# Patient Record
Sex: Female | Born: 1950 | Race: Black or African American | Hispanic: No | Smoking: Never smoker
Health system: Southern US, Community
[De-identification: ages and names within clinical notes are randomized; demographics above are authoritative.]

## PROBLEM LIST (undated history)

## (undated) DIAGNOSIS — N926 Irregular menstruation, unspecified: Secondary | ICD-10-CM

## (undated) DIAGNOSIS — G43909 Migraine, unspecified, not intractable, without status migrainosus: Secondary | ICD-10-CM

## (undated) DIAGNOSIS — E119 Type 2 diabetes mellitus without complications: Secondary | ICD-10-CM

## (undated) DIAGNOSIS — R51 Headache: Secondary | ICD-10-CM

## (undated) DIAGNOSIS — Z21 Asymptomatic human immunodeficiency virus [HIV] infection status: Secondary | ICD-10-CM

## (undated) HISTORY — DX: Irregular menstruation, unspecified: N92.6

## (undated) HISTORY — DX: Headache: R51

## (undated) HISTORY — DX: Migraine, unspecified, not intractable, without status migrainosus: G43.909

## (undated) HISTORY — DX: Type 2 diabetes mellitus without complications: E11.9

## (undated) HISTORY — PX: TUBAL LIGATION: SHX77

## (undated) HISTORY — DX: Asymptomatic human immunodeficiency virus (hiv) infection status: Z21

---

## 1996-07-17 DIAGNOSIS — Z21 Asymptomatic human immunodeficiency virus [HIV] infection status: Secondary | ICD-10-CM

## 1996-07-17 HISTORY — DX: Asymptomatic human immunodeficiency virus (hiv) infection status: Z21

## 1997-01-14 ENCOUNTER — Encounter (INDEPENDENT_AMBULATORY_CARE_PROVIDER_SITE_OTHER): Payer: Self-pay | Admitting: *Deleted

## 1997-01-14 LAB — CONVERTED CEMR LAB: CD4 Count: 210 microliters

## 1997-12-22 ENCOUNTER — Ambulatory Visit (HOSPITAL_COMMUNITY): Admission: RE | Admit: 1997-12-22 | Discharge: 1997-12-22 | Payer: Self-pay | Admitting: Obstetrics and Gynecology

## 1998-03-23 ENCOUNTER — Ambulatory Visit: Admission: RE | Admit: 1998-03-23 | Discharge: 1998-03-23 | Payer: Self-pay | Admitting: Family Medicine

## 1998-03-30 ENCOUNTER — Other Ambulatory Visit: Admission: RE | Admit: 1998-03-30 | Discharge: 1998-03-30 | Payer: Self-pay | Admitting: Obstetrics and Gynecology

## 1998-04-13 ENCOUNTER — Encounter: Admission: RE | Admit: 1998-04-13 | Discharge: 1998-04-13 | Payer: Self-pay | Admitting: Internal Medicine

## 1998-12-27 ENCOUNTER — Ambulatory Visit (HOSPITAL_COMMUNITY): Admission: RE | Admit: 1998-12-27 | Discharge: 1998-12-27 | Payer: Self-pay | Admitting: Obstetrics and Gynecology

## 1999-04-19 ENCOUNTER — Encounter: Admission: RE | Admit: 1999-04-19 | Discharge: 1999-04-19 | Payer: Self-pay | Admitting: Internal Medicine

## 2000-04-25 ENCOUNTER — Other Ambulatory Visit: Admission: RE | Admit: 2000-04-25 | Discharge: 2000-04-25 | Payer: Self-pay | Admitting: Obstetrics and Gynecology

## 2000-05-11 ENCOUNTER — Encounter: Admission: RE | Admit: 2000-05-11 | Discharge: 2000-05-11 | Payer: Self-pay | Admitting: Internal Medicine

## 2000-10-03 ENCOUNTER — Encounter: Payer: Self-pay | Admitting: Obstetrics and Gynecology

## 2000-10-03 ENCOUNTER — Ambulatory Visit (HOSPITAL_COMMUNITY): Admission: RE | Admit: 2000-10-03 | Discharge: 2000-10-03 | Payer: Self-pay | Admitting: Obstetrics and Gynecology

## 2001-04-08 ENCOUNTER — Encounter: Admission: RE | Admit: 2001-04-08 | Discharge: 2001-04-08 | Payer: Self-pay | Admitting: Internal Medicine

## 2001-04-08 ENCOUNTER — Ambulatory Visit (HOSPITAL_COMMUNITY): Admission: RE | Admit: 2001-04-08 | Discharge: 2001-04-08 | Payer: Self-pay | Admitting: Internal Medicine

## 2001-04-30 ENCOUNTER — Encounter: Admission: RE | Admit: 2001-04-30 | Discharge: 2001-04-30 | Payer: Self-pay | Admitting: Internal Medicine

## 2001-09-23 ENCOUNTER — Other Ambulatory Visit: Admission: RE | Admit: 2001-09-23 | Discharge: 2001-09-23 | Payer: Self-pay | Admitting: Obstetrics and Gynecology

## 2001-10-10 ENCOUNTER — Ambulatory Visit (HOSPITAL_COMMUNITY): Admission: RE | Admit: 2001-10-10 | Discharge: 2001-10-10 | Payer: Self-pay | Admitting: Obstetrics and Gynecology

## 2001-10-10 ENCOUNTER — Encounter: Payer: Self-pay | Admitting: Obstetrics and Gynecology

## 2001-10-14 ENCOUNTER — Encounter: Admission: RE | Admit: 2001-10-14 | Discharge: 2001-10-14 | Payer: Self-pay | Admitting: Obstetrics and Gynecology

## 2001-10-14 ENCOUNTER — Encounter: Payer: Self-pay | Admitting: Obstetrics and Gynecology

## 2002-04-28 ENCOUNTER — Ambulatory Visit (HOSPITAL_COMMUNITY): Admission: RE | Admit: 2002-04-28 | Discharge: 2002-04-28 | Payer: Self-pay | Admitting: Internal Medicine

## 2002-04-28 ENCOUNTER — Encounter: Admission: RE | Admit: 2002-04-28 | Discharge: 2002-04-28 | Payer: Self-pay | Admitting: Internal Medicine

## 2002-05-27 ENCOUNTER — Encounter: Admission: RE | Admit: 2002-05-27 | Discharge: 2002-05-27 | Payer: Self-pay | Admitting: Internal Medicine

## 2002-08-20 ENCOUNTER — Other Ambulatory Visit: Admission: RE | Admit: 2002-08-20 | Discharge: 2002-08-20 | Payer: Self-pay | Admitting: Obstetrics and Gynecology

## 2002-11-14 ENCOUNTER — Encounter: Admission: RE | Admit: 2002-11-14 | Discharge: 2002-11-14 | Payer: Self-pay | Admitting: Obstetrics and Gynecology

## 2002-11-14 ENCOUNTER — Encounter: Payer: Self-pay | Admitting: Obstetrics and Gynecology

## 2002-12-05 ENCOUNTER — Encounter: Payer: Self-pay | Admitting: Internal Medicine

## 2002-12-05 ENCOUNTER — Encounter: Admission: RE | Admit: 2002-12-05 | Discharge: 2002-12-05 | Payer: Self-pay | Admitting: Internal Medicine

## 2002-12-23 ENCOUNTER — Encounter: Admission: RE | Admit: 2002-12-23 | Discharge: 2002-12-23 | Payer: Self-pay | Admitting: Internal Medicine

## 2003-05-27 ENCOUNTER — Encounter: Admission: RE | Admit: 2003-05-27 | Discharge: 2003-05-27 | Payer: Self-pay | Admitting: Internal Medicine

## 2003-05-27 ENCOUNTER — Encounter: Payer: Self-pay | Admitting: Internal Medicine

## 2003-05-27 ENCOUNTER — Ambulatory Visit (HOSPITAL_COMMUNITY): Admission: RE | Admit: 2003-05-27 | Discharge: 2003-05-27 | Payer: Self-pay | Admitting: Internal Medicine

## 2003-06-15 ENCOUNTER — Ambulatory Visit (HOSPITAL_COMMUNITY): Admission: RE | Admit: 2003-06-15 | Discharge: 2003-06-15 | Payer: Self-pay | Admitting: Gastroenterology

## 2003-06-16 ENCOUNTER — Encounter: Admission: RE | Admit: 2003-06-16 | Discharge: 2003-06-16 | Payer: Self-pay | Admitting: Internal Medicine

## 2003-09-07 ENCOUNTER — Other Ambulatory Visit: Admission: RE | Admit: 2003-09-07 | Discharge: 2003-09-07 | Payer: Self-pay | Admitting: Obstetrics and Gynecology

## 2003-12-08 ENCOUNTER — Encounter: Admission: RE | Admit: 2003-12-08 | Discharge: 2003-12-08 | Payer: Self-pay | Admitting: Internal Medicine

## 2003-12-08 ENCOUNTER — Ambulatory Visit (HOSPITAL_COMMUNITY): Admission: RE | Admit: 2003-12-08 | Discharge: 2003-12-08 | Payer: Self-pay | Admitting: Internal Medicine

## 2003-12-22 ENCOUNTER — Encounter: Admission: RE | Admit: 2003-12-22 | Discharge: 2003-12-22 | Payer: Self-pay | Admitting: Internal Medicine

## 2004-01-22 ENCOUNTER — Encounter: Admission: RE | Admit: 2004-01-22 | Discharge: 2004-01-22 | Payer: Self-pay | Admitting: Obstetrics and Gynecology

## 2004-05-19 ENCOUNTER — Ambulatory Visit (HOSPITAL_COMMUNITY): Admission: RE | Admit: 2004-05-19 | Discharge: 2004-05-19 | Payer: Self-pay | Admitting: Internal Medicine

## 2004-05-19 ENCOUNTER — Ambulatory Visit: Payer: Self-pay | Admitting: Internal Medicine

## 2004-05-23 ENCOUNTER — Encounter: Admission: RE | Admit: 2004-05-23 | Discharge: 2004-06-27 | Payer: Self-pay | Admitting: Family Medicine

## 2004-06-21 ENCOUNTER — Ambulatory Visit: Payer: Self-pay | Admitting: Internal Medicine

## 2004-12-14 ENCOUNTER — Other Ambulatory Visit: Admission: RE | Admit: 2004-12-14 | Discharge: 2004-12-14 | Payer: Self-pay | Admitting: Obstetrics and Gynecology

## 2005-01-09 ENCOUNTER — Encounter (INDEPENDENT_AMBULATORY_CARE_PROVIDER_SITE_OTHER): Payer: Self-pay | Admitting: *Deleted

## 2005-01-09 ENCOUNTER — Ambulatory Visit: Payer: Self-pay | Admitting: Internal Medicine

## 2005-01-09 ENCOUNTER — Ambulatory Visit (HOSPITAL_COMMUNITY): Admission: RE | Admit: 2005-01-09 | Discharge: 2005-01-09 | Payer: Self-pay | Admitting: Internal Medicine

## 2005-01-09 LAB — CONVERTED CEMR LAB: HIV 1 RNA Quant: 399 copies/mL

## 2005-01-24 ENCOUNTER — Ambulatory Visit: Payer: Self-pay | Admitting: Internal Medicine

## 2005-02-17 ENCOUNTER — Encounter: Admission: RE | Admit: 2005-02-17 | Discharge: 2005-02-17 | Payer: Self-pay | Admitting: Obstetrics and Gynecology

## 2005-06-12 ENCOUNTER — Ambulatory Visit: Payer: Self-pay | Admitting: Internal Medicine

## 2005-06-12 ENCOUNTER — Ambulatory Visit (HOSPITAL_COMMUNITY): Admission: RE | Admit: 2005-06-12 | Discharge: 2005-06-12 | Payer: Self-pay | Admitting: Internal Medicine

## 2005-06-12 ENCOUNTER — Encounter (INDEPENDENT_AMBULATORY_CARE_PROVIDER_SITE_OTHER): Payer: Self-pay | Admitting: *Deleted

## 2005-06-12 LAB — CONVERTED CEMR LAB: HIV 1 RNA Quant: 399 copies/mL

## 2005-06-27 ENCOUNTER — Ambulatory Visit: Payer: Self-pay | Admitting: Internal Medicine

## 2005-07-04 ENCOUNTER — Ambulatory Visit: Payer: Self-pay | Admitting: Internal Medicine

## 2005-12-19 ENCOUNTER — Other Ambulatory Visit: Admission: RE | Admit: 2005-12-19 | Discharge: 2005-12-19 | Payer: Self-pay | Admitting: Obstetrics and Gynecology

## 2005-12-19 ENCOUNTER — Encounter (INDEPENDENT_AMBULATORY_CARE_PROVIDER_SITE_OTHER): Payer: Self-pay | Admitting: *Deleted

## 2005-12-19 LAB — CONVERTED CEMR LAB: Pap Smear: NORMAL

## 2006-01-04 ENCOUNTER — Encounter (INDEPENDENT_AMBULATORY_CARE_PROVIDER_SITE_OTHER): Payer: Self-pay | Admitting: *Deleted

## 2006-01-04 ENCOUNTER — Ambulatory Visit: Payer: Self-pay | Admitting: Internal Medicine

## 2006-01-04 ENCOUNTER — Encounter: Admission: RE | Admit: 2006-01-04 | Discharge: 2006-01-04 | Payer: Self-pay | Admitting: Internal Medicine

## 2006-01-04 LAB — CONVERTED CEMR LAB
CD4 Count: 700 microliters
HIV 1 RNA Quant: 399 copies/mL

## 2006-01-23 ENCOUNTER — Ambulatory Visit: Payer: Self-pay | Admitting: Internal Medicine

## 2006-03-09 ENCOUNTER — Emergency Department (HOSPITAL_COMMUNITY): Admission: EM | Admit: 2006-03-09 | Discharge: 2006-03-09 | Payer: Self-pay | Admitting: Emergency Medicine

## 2006-03-09 IMAGING — CT CT HEAD W/O CM
5 of 6 series · 18 of 37 positions shown, 19 images · IV contrast (agent unspecified)
Comparison: none

CLINICAL DATA: Status post fall.
 HEAD CT WITHOUT CONTRAST:
TECHNIQUE: Contiguous axial images were obtained from the base of the skull through the vertex according to standard protocol without contrast.
TECHNIQUE: Multidetector CT imaging of the cervical spine was performed.  Multiplanar CT image reconstructions were also generated.

[Series 2: brain · axial · 0.47mm/px · z∈[-61,-16]mm · 2 of 28 slices shown, 3 images]
[im 10/28  brain]
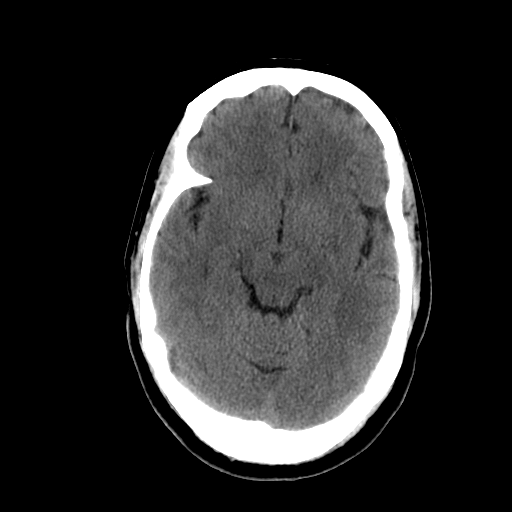
[im 10/28  bone]
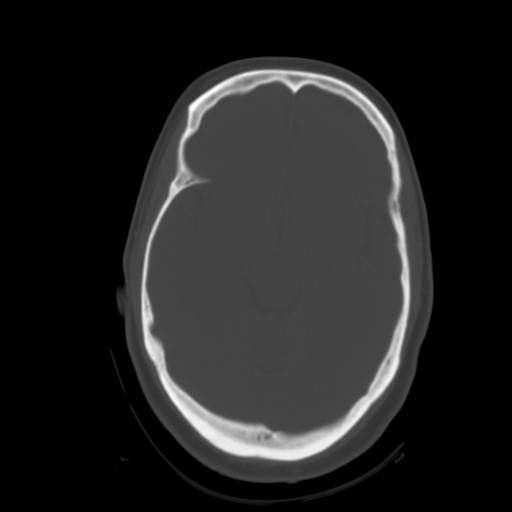
[im 19/28  brain]
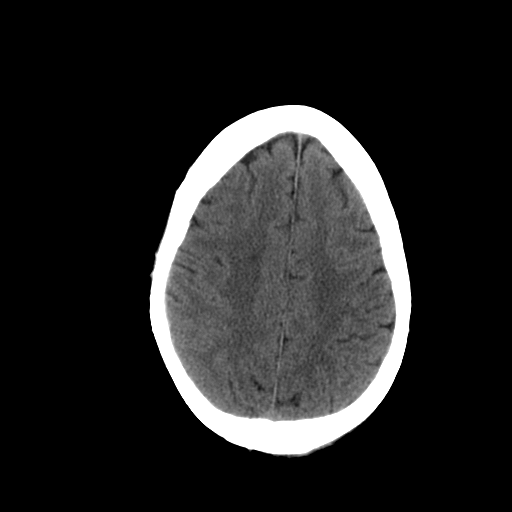

[Series 3: recon 2: brain · axial · 0.47mm/px · z∈[-61,-16]mm · 2 of 28 slices shown]
[im 10/28  brain]
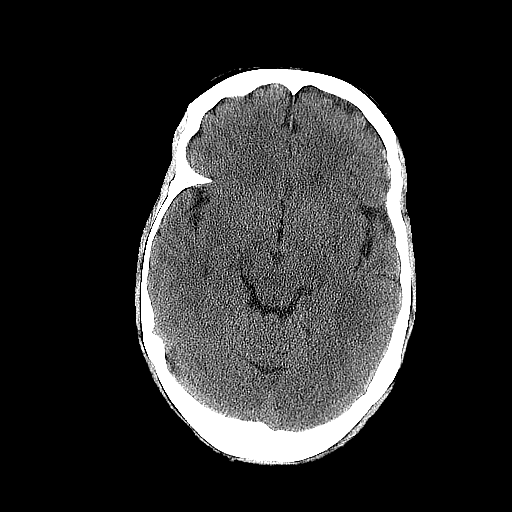
[im 19/28  brain]
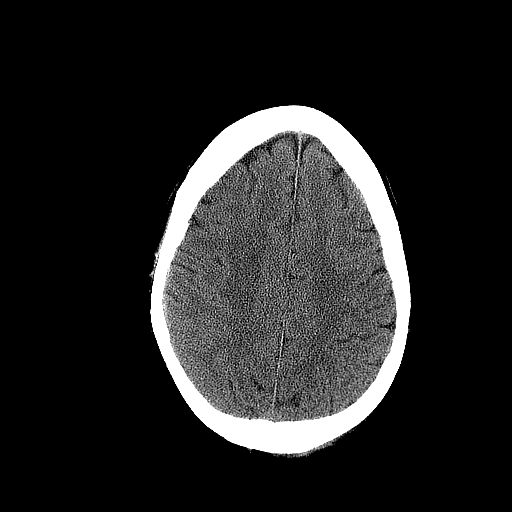

[Series 5: cervical spine · axial · 0.27mm/px · z∈[-262,-129]mm · 8 of 67 slices shown]
[im 7/67  brain]
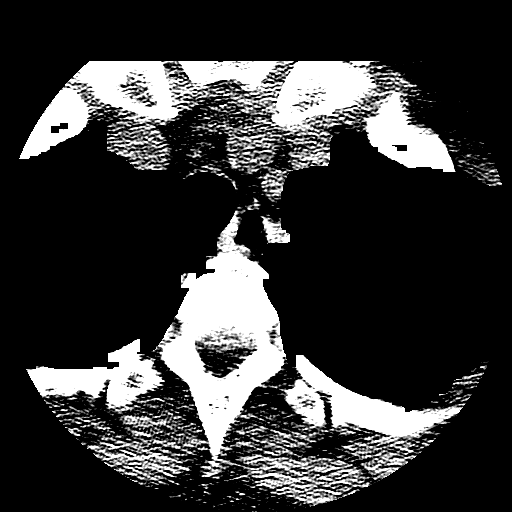
[im 14/67  brain]
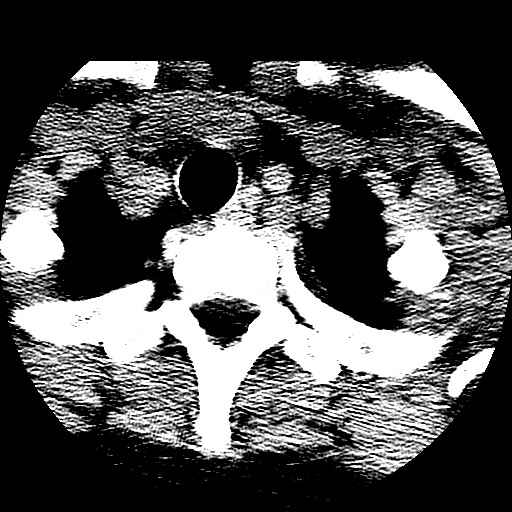
[im 20/67  brain]
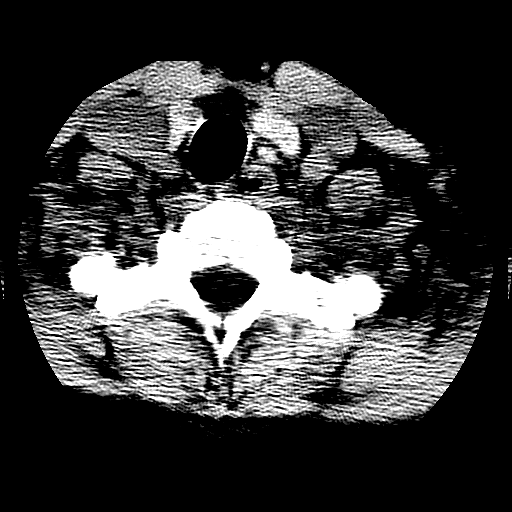
[im 27/67  brain]
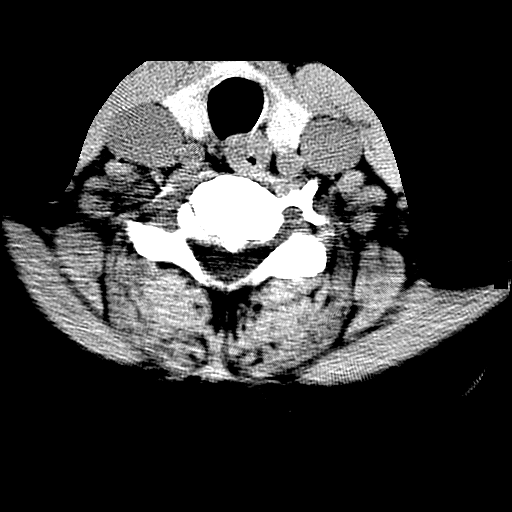
[im 40/67  brain]
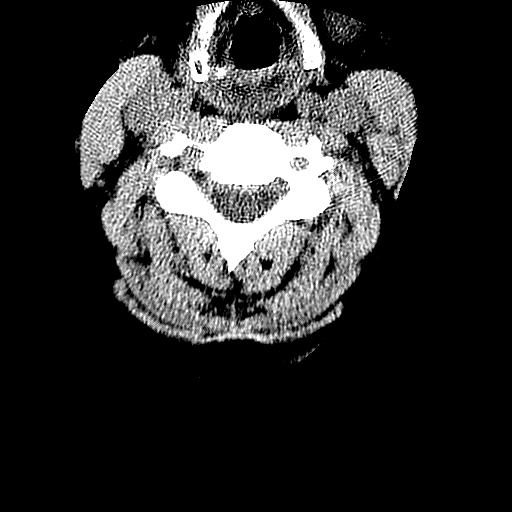
[im 47/67  brain]
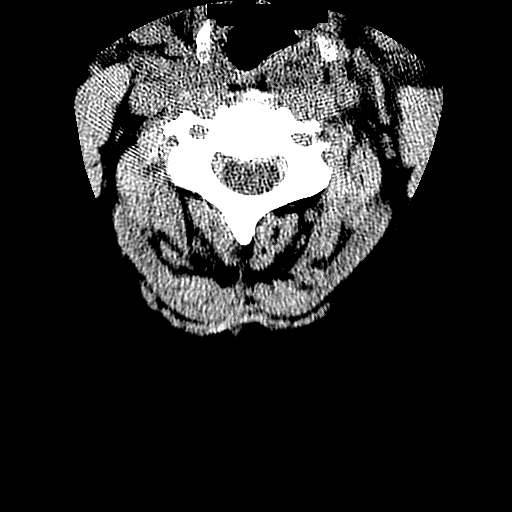
[im 53/67  brain]
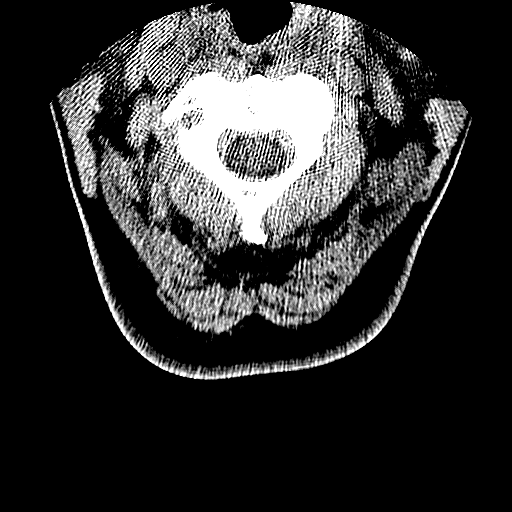
[im 60/67  brain]
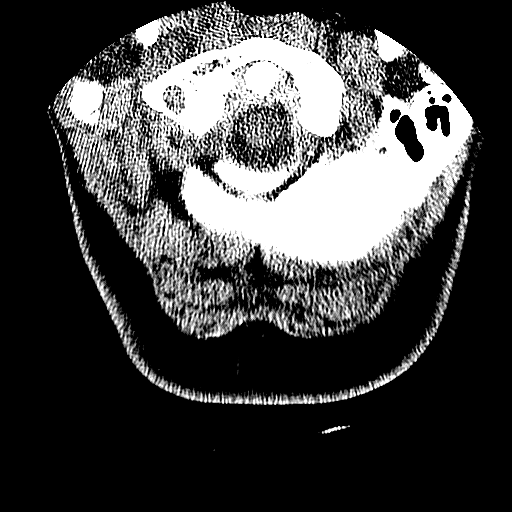

[Series 6: recon 2: cervical spine · axial · 0.27mm/px · z∈[-262,-229]mm · 3 of 67 slices shown]
[im 7/67  brain]
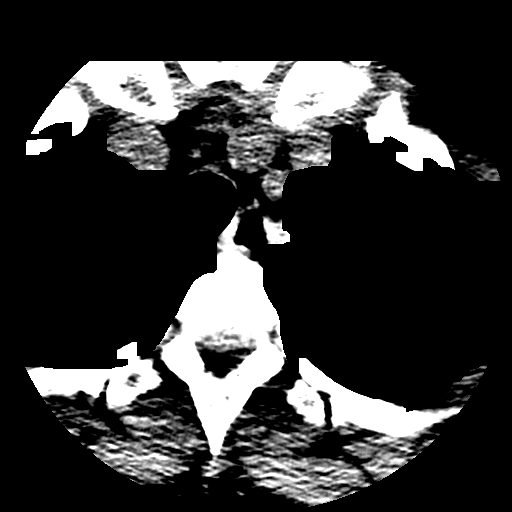
[im 14/67  brain]
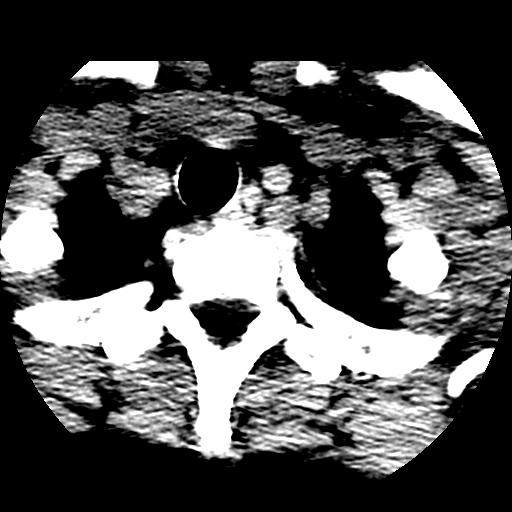
[im 20/67  brain]
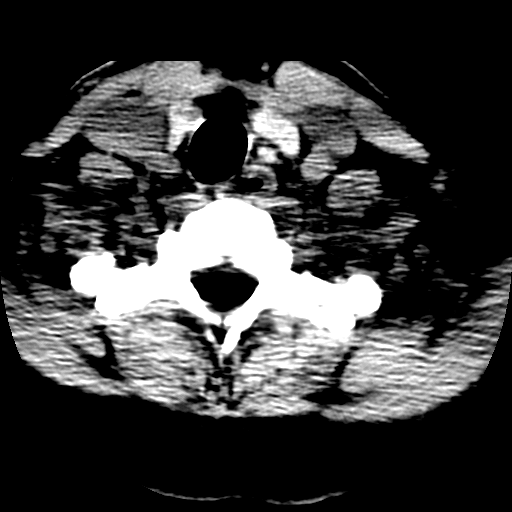

[Series 701: reformatted · coronal · 0.33mm/px · 3 of 36 slices shown]
[im 10/36  brain]
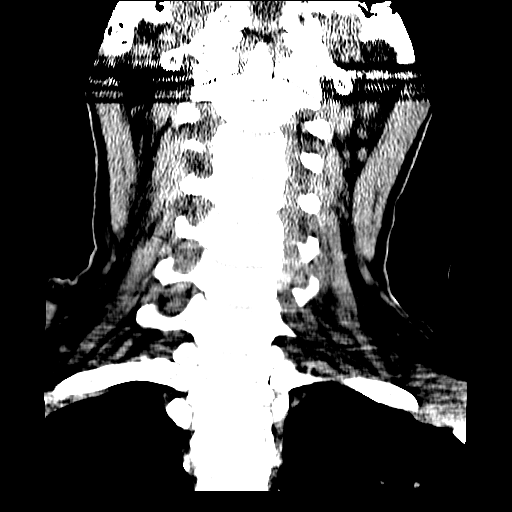
[im 15/36  brain]
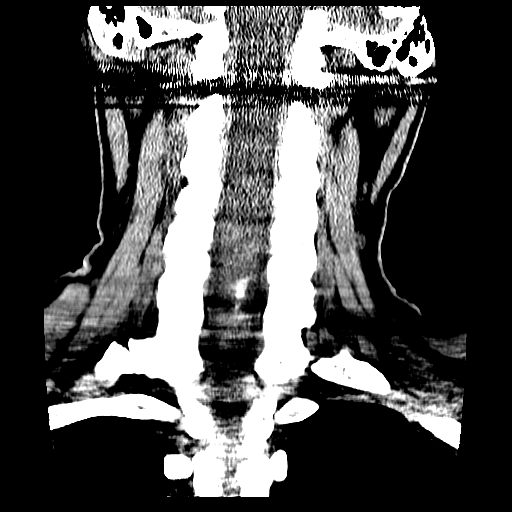
[im 19/36  brain]
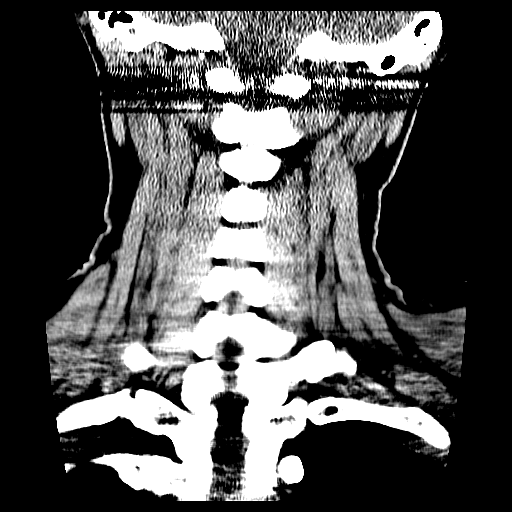

[18 of 37 positions shown; findings below may reference images not displayed]

FINDINGS: The brain parenchyma is normal in attenuation and morphology.  The ventricular volumes are within normal limits.  The midline is maintained.  There is no edema or mass effect.  
 Mastoid air cells and paranasal sinuses are normally aerated.
 Review of the bone windows shows no evidence for a skull fracture.
IMPRESSION: No acute intracranial abnormalities.  
 CERVICAL SPINE CT WITHOUT CONTRAST:
FINDINGS: The alignment of cervical spine is normal.  The vertebral body heights and the disc spaces are well preserved.  There are no dislocations.  No fractures are identified.
IMPRESSION: 1. No acute cervical spine CT findings. 
 2. Mild multilevel degenerative disc disease.

## 2006-03-12 ENCOUNTER — Emergency Department (HOSPITAL_COMMUNITY): Admission: EM | Admit: 2006-03-12 | Discharge: 2006-03-12 | Payer: Self-pay | Admitting: Emergency Medicine

## 2006-07-28 DIAGNOSIS — B20 Human immunodeficiency virus [HIV] disease: Secondary | ICD-10-CM | POA: Insufficient documentation

## 2006-07-28 DIAGNOSIS — R51 Headache: Secondary | ICD-10-CM | POA: Insufficient documentation

## 2006-07-28 DIAGNOSIS — R519 Headache, unspecified: Secondary | ICD-10-CM | POA: Insufficient documentation

## 2006-07-28 DIAGNOSIS — E119 Type 2 diabetes mellitus without complications: Secondary | ICD-10-CM | POA: Insufficient documentation

## 2006-08-03 ENCOUNTER — Encounter: Payer: Self-pay | Admitting: Internal Medicine

## 2006-09-10 ENCOUNTER — Encounter (INDEPENDENT_AMBULATORY_CARE_PROVIDER_SITE_OTHER): Payer: Self-pay | Admitting: *Deleted

## 2006-09-10 LAB — CONVERTED CEMR LAB

## 2006-09-23 ENCOUNTER — Encounter (INDEPENDENT_AMBULATORY_CARE_PROVIDER_SITE_OTHER): Payer: Self-pay | Admitting: *Deleted

## 2006-09-26 ENCOUNTER — Ambulatory Visit: Payer: Self-pay | Admitting: Internal Medicine

## 2006-09-26 ENCOUNTER — Encounter: Admission: RE | Admit: 2006-09-26 | Discharge: 2006-09-26 | Payer: Self-pay | Admitting: Internal Medicine

## 2006-09-26 LAB — CONVERTED CEMR LAB
AST: 15 units/L (ref 0–37)
Alkaline Phosphatase: 51 units/L (ref 39–117)
BUN: 12 mg/dL (ref 6–23)
Basophils Relative: 1 % (ref 0–1)
Bilirubin Urine: NEGATIVE
Creatinine, Ser: 0.6 mg/dL (ref 0.40–1.20)
Eosinophils Absolute: 0 10*3/uL (ref 0.0–0.7)
Eosinophils Relative: 1 % (ref 0–5)
Glucose, Bld: 119 mg/dL — ABNORMAL HIGH (ref 70–99)
HCT: 40.2 % (ref 36.0–46.0)
HDL: 65 mg/dL (ref >39)
HIV-1 RNA Quant, Log: <1.7 (ref <1.70)
Hemoglobin, Urine: NEGATIVE
Ketones, ur: NEGATIVE mg/dL
LDL Cholesterol: 138 mg/dL — ABNORMAL HIGH (ref 0–99)
Lymphs Abs: 1.4 10*3/uL (ref 0.7–3.3)
MCHC: 33.8 g/dL (ref 30.0–36.0)
MCV: 91.2 fL (ref 78.0–100.0)
Monocytes Relative: 8 % (ref 3–11)
Neutrophils Relative %: 54 % (ref 43–77)
Protein, ur: NEGATIVE mg/dL
RBC: 4.41 M/uL (ref 3.87–5.11)
Specific Gravity, Urine: 1.019 (ref 1.005–1.03)
Total Bilirubin: 0.2 mg/dL — ABNORMAL LOW (ref 0.3–1.2)
Total CHOL/HDL Ratio: 3.4
Triglycerides: 91 mg/dL (ref <150)
Urobilinogen, UA: 0.2 (ref 0.0–1.0)
VLDL: 18 mg/dL (ref 0–40)
WBC: 3.8 10*3/uL — ABNORMAL LOW (ref 4.0–10.5)

## 2006-10-01 ENCOUNTER — Encounter: Admission: RE | Admit: 2006-10-01 | Discharge: 2006-10-01 | Payer: Self-pay | Admitting: Obstetrics and Gynecology

## 2006-10-11 ENCOUNTER — Ambulatory Visit: Payer: Self-pay | Admitting: Internal Medicine

## 2007-04-26 ENCOUNTER — Encounter: Payer: Self-pay | Admitting: Internal Medicine

## 2007-07-16 ENCOUNTER — Encounter: Payer: Self-pay | Admitting: Internal Medicine

## 2007-08-13 ENCOUNTER — Encounter (INDEPENDENT_AMBULATORY_CARE_PROVIDER_SITE_OTHER): Payer: Self-pay | Admitting: *Deleted

## 2007-08-23 ENCOUNTER — Encounter: Admission: RE | Admit: 2007-08-23 | Discharge: 2007-08-23 | Payer: Self-pay | Admitting: Internal Medicine

## 2007-08-23 ENCOUNTER — Ambulatory Visit: Payer: Self-pay | Admitting: Internal Medicine

## 2007-08-23 LAB — CONVERTED CEMR LAB
HIV 1 RNA Quant: <50 copies/mL (ref <50)
HIV-1 RNA Quant, Log: <1.7 (ref <1.70)

## 2007-08-29 ENCOUNTER — Encounter (INDEPENDENT_AMBULATORY_CARE_PROVIDER_SITE_OTHER): Payer: Self-pay | Admitting: *Deleted

## 2007-09-10 ENCOUNTER — Ambulatory Visit: Payer: Self-pay | Admitting: Internal Medicine

## 2007-09-10 LAB — CONVERTED CEMR LAB
AST: 18 units/L (ref 0–37)
Albumin: 4.2 g/dL (ref 3.5–5.2)
Alkaline Phosphatase: 70 units/L (ref 39–117)
Calcium: 9 mg/dL (ref 8.4–10.5)
Chloride: 109 meq/L (ref 96–112)
Glucose, Bld: 127 mg/dL — ABNORMAL HIGH (ref 70–99)
LDL Cholesterol: 108 mg/dL — ABNORMAL HIGH (ref 0–99)
MCHC: 33.2 g/dL (ref 30.0–36.0)
Potassium: 4.3 meq/L (ref 3.5–5.3)
RBC: 4.24 M/uL (ref 3.87–5.11)
Sodium: 143 meq/L (ref 135–145)
Total Protein: 7 g/dL (ref 6.0–8.3)
Triglycerides: 109 mg/dL (ref <150)

## 2007-09-18 ENCOUNTER — Telehealth: Payer: Self-pay | Admitting: Internal Medicine

## 2007-10-16 ENCOUNTER — Encounter: Admission: RE | Admit: 2007-10-16 | Discharge: 2007-10-16 | Payer: Self-pay | Admitting: Obstetrics and Gynecology

## 2007-10-22 ENCOUNTER — Telehealth (INDEPENDENT_AMBULATORY_CARE_PROVIDER_SITE_OTHER): Payer: Self-pay | Admitting: *Deleted

## 2008-04-16 ENCOUNTER — Encounter: Payer: Self-pay | Admitting: Internal Medicine

## 2008-06-04 ENCOUNTER — Ambulatory Visit: Payer: Self-pay | Admitting: Internal Medicine

## 2008-06-04 LAB — CONVERTED CEMR LAB
ALT: 14 units/L (ref 0–35)
BUN: 11 mg/dL (ref 6–23)
CO2: 25 meq/L (ref 19–32)
Calcium: 8.9 mg/dL (ref 8.4–10.5)
Chloride: 108 meq/L (ref 96–112)
Creatinine, Ser: 0.66 mg/dL (ref 0.40–1.20)
Glucose, Bld: 114 mg/dL — ABNORMAL HIGH (ref 70–99)
HIV 1 RNA Quant: <48 copies/mL (ref <48)
Hemoglobin: 12 g/dL (ref 12.0–15.0)
Lymphocytes Relative: 44 % (ref 12–46)
Lymphs Abs: 2.1 10*3/uL (ref 0.7–4.0)
Monocytes Absolute: 0.3 10*3/uL (ref 0.1–1.0)
Monocytes Relative: 7 % (ref 3–12)
Neutro Abs: 2.3 10*3/uL (ref 1.7–7.7)
RBC: 3.88 M/uL (ref 3.87–5.11)

## 2008-06-25 ENCOUNTER — Ambulatory Visit: Payer: Self-pay | Admitting: Internal Medicine

## 2008-09-30 ENCOUNTER — Ambulatory Visit: Payer: Self-pay | Admitting: Internal Medicine

## 2008-09-30 LAB — CONVERTED CEMR LAB
ALT: 12 units/L (ref 0–35)
AST: 15 units/L (ref 0–37)
Alkaline Phosphatase: 68 units/L (ref 39–117)
Basophils Relative: 1 % (ref 0–1)
CO2: 26 meq/L (ref 19–32)
Cholesterol: 217 mg/dL — ABNORMAL HIGH (ref 0–200)
HIV 1 RNA Quant: <48 copies/mL (ref <48)
HIV-1 RNA Quant, Log: <1.68 (ref <1.68)
LDL Cholesterol: 149 mg/dL — ABNORMAL HIGH (ref 0–99)
MCHC: 33.8 g/dL (ref 30.0–36.0)
Monocytes Relative: 7 % (ref 3–12)
Neutro Abs: 1.7 10*3/uL (ref 1.7–7.7)
Neutrophils Relative %: 39 % — ABNORMAL LOW (ref 43–77)
RBC: 4.28 M/uL (ref 3.87–5.11)
Sodium: 138 meq/L (ref 135–145)
Total Bilirubin: 0.3 mg/dL (ref 0.3–1.2)
Total Protein: 7.1 g/dL (ref 6.0–8.3)
VLDL: 17 mg/dL (ref 0–40)
WBC: 4.3 10*3/uL (ref 4.0–10.5)

## 2008-10-16 ENCOUNTER — Encounter: Admission: RE | Admit: 2008-10-16 | Discharge: 2008-10-16 | Payer: Self-pay | Admitting: Obstetrics and Gynecology

## 2008-10-21 ENCOUNTER — Ambulatory Visit: Payer: Self-pay | Admitting: Internal Medicine

## 2008-11-02 ENCOUNTER — Ambulatory Visit: Payer: Self-pay | Admitting: Internal Medicine

## 2008-11-13 ENCOUNTER — Encounter (INDEPENDENT_AMBULATORY_CARE_PROVIDER_SITE_OTHER): Payer: Self-pay | Admitting: *Deleted

## 2009-01-29 ENCOUNTER — Encounter (INDEPENDENT_AMBULATORY_CARE_PROVIDER_SITE_OTHER): Payer: Self-pay | Admitting: *Deleted

## 2009-01-29 ENCOUNTER — Encounter: Payer: Self-pay | Admitting: Internal Medicine

## 2009-04-22 ENCOUNTER — Ambulatory Visit: Payer: Self-pay | Admitting: Internal Medicine

## 2009-05-11 ENCOUNTER — Ambulatory Visit: Payer: Self-pay | Admitting: Internal Medicine

## 2009-09-04 ENCOUNTER — Encounter: Payer: Self-pay | Admitting: Internal Medicine

## 2009-10-19 ENCOUNTER — Ambulatory Visit: Payer: Self-pay | Admitting: Internal Medicine

## 2009-10-19 LAB — CONVERTED CEMR LAB
Alkaline Phosphatase: 73 units/L (ref 39–117)
BUN: 12 mg/dL (ref 6–23)
Basophils Relative: 0 % (ref 0–1)
Cholesterol: 224 mg/dL — ABNORMAL HIGH (ref 0–200)
Eosinophils Absolute: 0 10*3/uL (ref 0.0–0.7)
Glucose, Bld: 112 mg/dL — ABNORMAL HIGH (ref 70–99)
HDL: 60 mg/dL (ref >39)
HIV 1 RNA Quant: <48 copies/mL (ref <48)
Hemoglobin: 12.1 g/dL (ref 12.0–15.0)
Lymphs Abs: 2.3 10*3/uL (ref 0.7–4.0)
MCHC: 32.8 g/dL (ref 30.0–36.0)
MCV: 86.6 fL (ref 78.0–100.0)
Monocytes Absolute: 0.3 10*3/uL (ref 0.1–1.0)
Monocytes Relative: 6 % (ref 3–12)
Neutro Abs: 1.9 10*3/uL (ref 1.7–7.7)
RBC: 4.26 M/uL (ref 3.87–5.11)
Total Bilirubin: 0.3 mg/dL (ref 0.3–1.2)
Total CHOL/HDL Ratio: 3.7
VLDL: 18 mg/dL (ref 0–40)
WBC: 4.5 10*3/uL (ref 4.0–10.5)

## 2009-10-22 ENCOUNTER — Encounter: Admission: RE | Admit: 2009-10-22 | Discharge: 2009-10-22 | Payer: Self-pay | Admitting: Obstetrics and Gynecology

## 2009-11-02 ENCOUNTER — Ambulatory Visit: Payer: Self-pay | Admitting: Internal Medicine

## 2009-11-02 DIAGNOSIS — J309 Allergic rhinitis, unspecified: Secondary | ICD-10-CM | POA: Insufficient documentation

## 2009-11-02 DIAGNOSIS — IMO0002 Reserved for concepts with insufficient information to code with codable children: Secondary | ICD-10-CM | POA: Insufficient documentation

## 2009-11-04 ENCOUNTER — Encounter (INDEPENDENT_AMBULATORY_CARE_PROVIDER_SITE_OTHER): Payer: Self-pay | Admitting: *Deleted

## 2010-03-02 ENCOUNTER — Encounter: Payer: Self-pay | Admitting: Internal Medicine

## 2010-03-02 LAB — CONVERTED CEMR LAB: Pap Smear: NEGATIVE

## 2010-05-18 ENCOUNTER — Ambulatory Visit: Payer: Self-pay | Admitting: Internal Medicine

## 2010-05-18 LAB — CONVERTED CEMR LAB
AST: 16 units/L (ref 0–37)
Albumin: 4.5 g/dL (ref 3.5–5.2)
Alkaline Phosphatase: 74 units/L (ref 39–117)
BUN: 8 mg/dL (ref 6–23)
Basophils Relative: 1 % (ref 0–1)
Creatinine, Ser: 0.62 mg/dL (ref 0.40–1.20)
Eosinophils Absolute: 0 10*3/uL (ref 0.0–0.7)
Eosinophils Relative: 1 % (ref 0–5)
Glucose, Bld: 108 mg/dL — ABNORMAL HIGH (ref 70–99)
HCT: 36.3 % (ref 36.0–46.0)
HDL: 47 mg/dL (ref >39)
LDL Cholesterol: 131 mg/dL — ABNORMAL HIGH (ref 0–99)
Lymphs Abs: 1.7 10*3/uL (ref 0.7–4.0)
MCHC: 32.5 g/dL (ref 30.0–36.0)
MCV: 88.3 fL (ref 78.0–100.0)
Monocytes Absolute: 0.3 10*3/uL (ref 0.1–1.0)
Monocytes Relative: 8 % (ref 3–12)
Neutrophils Relative %: 46 % (ref 43–77)
Potassium: 4.1 meq/L (ref 3.5–5.3)
RBC: 4.11 M/uL (ref 3.87–5.11)
Total Bilirubin: 0.3 mg/dL (ref 0.3–1.2)
Total CHOL/HDL Ratio: 4.2
Triglycerides: 93 mg/dL (ref <150)
VLDL: 19 mg/dL (ref 0–40)
WBC: 3.7 10*3/uL — ABNORMAL LOW (ref 4.0–10.5)

## 2010-06-02 ENCOUNTER — Encounter: Payer: Self-pay | Admitting: Internal Medicine

## 2010-06-02 ENCOUNTER — Ambulatory Visit: Payer: Self-pay | Admitting: Internal Medicine

## 2010-06-13 ENCOUNTER — Encounter (INDEPENDENT_AMBULATORY_CARE_PROVIDER_SITE_OTHER): Payer: Self-pay | Admitting: *Deleted

## 2010-07-06 ENCOUNTER — Encounter (INDEPENDENT_AMBULATORY_CARE_PROVIDER_SITE_OTHER): Payer: Self-pay | Admitting: *Deleted

## 2010-08-07 ENCOUNTER — Encounter: Payer: Self-pay | Admitting: Obstetrics and Gynecology

## 2010-08-18 NOTE — Miscellaneous (Signed)
Summary: Dr.Stringer  Dr.Stringer   Imported By: Florinda Marker 11/03/2009 15:42:20  _____________________________________________________________________  External Attachment:    Type:   Image     Comment:   External Document

## 2010-08-18 NOTE — Miscellaneous (Signed)
  Clinical Lists Changes 

## 2010-08-18 NOTE — Assessment & Plan Note (Signed)
Summary: Office Visit (Infectious Disease)    CC:  follow-up visit.  History of Present Illness: Chloe Hancock is in for her routine visit. She continues to do well and does not recall missing doses of Atripla.  Preventive Screening-Counseling & Management  Alcohol-Tobacco     Alcohol drinks/day: <1     Alcohol type: wine     Smoking Status: never     Passive Smoke Exposure: no  Caffeine-Diet-Exercise     Caffeine use/day: no     Does Patient Exercise: yes     Type of exercise: gym, walking     Exercise (avg: min/session): 30-60     Times/week: 3  Hep-HIV-STD-Contraception     HIV Risk: no risk noted     HIV Risk Counseling: not indicated-no HIV risk noted  Safety-Violence-Falls     Seat Belt Use: yes  Comments: declined condoms      Sexual History:  n/a.        Drug Use:  never.     Prior Medication List:  ATRIPLA 600-200-300 MG TABS (EFAVIRENZ-EMTRICITAB-TENOFOVIR) Take 1 tablet by mouth once a day * QUINAPRIL 25 MG HC MG  TABS (QUINAPRIL HCL) Take 1/2 tablet by mouth once a day * ALLERGY NASAL SPRAY    Current Allergies (reviewed today): No known allergies  Social History: Sexual History:  n/a  Vital Signs:  Patient profile:   60 year old female Menstrual status:  postmenopausal Height:      65 inches (165.10 cm) Weight:      160.5 pounds (72.95 kg) BMI:     26.81 Temp:     97.5 degrees F (36.39 degrees C) oral Pulse rate:   80 / minute BP sitting:   147 / 81  (left arm) Cuff size:   regular  Vitals Entered By: Jennet Maduro RN (June 02, 2010 10:55 AM) CC: follow-up visit Is Patient Diabetic? No Pain Assessment Patient in pain? no      Nutritional Status BMI of 25 - 29 = overweight Nutritional Status Detail appetite "GOOD"  Have you ever been in a relationship where you felt threatened, hurt or afraid?No   Does patient need assistance? Functional Status Self care Ambulation Normal Comments no missed doses of rxes   Physical  Exam  General:  alert and well-nourished.   Mouth:  good dentition, pharynx pink and moist, no erythema, and no exudates.   Lungs:  normal breath sounds.  no crackles and no wheezes.   Heart:  normal rate, regular rhythm, and no murmur.          Medication Adherence: 06/02/2010   Adherence to medications reviewed with patient. Counseling to provide adequate adherence provided                                Impression & Recommendations:  Problem # 1:  HIV DISEASE (ICD-042) Her infection remains under excellent control. She is having a little concern over rising copays for Atripla. We will see if she is eligible for vouchers. Diagnostics Reviewed:  HIV: CDC-defined AIDS (09/04/2009)   CD4: 500 (05/19/2010)   WBC: 3.7 (05/18/2010)   Hgb: 11.8 (05/18/2010)   HCT: 36.3 (05/18/2010)   Platelets: 207 (05/18/2010) HIV-1 RNA: <20 copies/mL (05/18/2010)   HBSAg: No (09/10/2006)  Other Orders: Est. Patient Level III (64403) Future Orders: T-CD4SP ([facility]) (CD4SP) ... 11/29/2010 T-HIV Viral Load [phone]) ... 11/29/2010 T-Comprehensive Metabolic Panel [phone]) ... 11/29/2010 T-CBC w/Diff ([id number]) .Marland KitchenMarland Kitchen  11/29/2010 T-RPR (Syphilis) [phone]) ... 11/29/2010 T-Lipid Profile [phone]) ... 11/29/2010  Patient Instructions: 1)  Please schedule a follow-up appointment in 6 months.   Process Orders Check Orders Results:     [facility]: ABN not required for this insurance Tests Sent for requisitioning (June 06, 2010 1:28 PM):     11/29/2010: [facility] -- T-HIV Viral Load [phone] (signed)     11/29/2010: [facility] -- T-Comprehensive Metabolic Panel [[id number]] (signed)     11/29/2010: [facility] -- T-CBC w/Diff [[id number]] (signed)     11/29/2010: [facility] -- T-RPR (Syphilis) [phone] (signed)     11/29/2010: [facility] -- T-Lipid Profile  [phone] (signed)          Medication Adherence: 06/02/2010   Adherence to medications reviewed with patient. Counseling to provide adequate adherence provided                                Influenza Immunization History:    Influenza # 1:  Historical (04/25/2010)

## 2010-08-18 NOTE — Assessment & Plan Note (Signed)
Summary: F/U/VS   CC:  follow-up visit and Pollen is bothering her nose.  History of Present Illness: Chloe Hancock is in for a routine visit.  She never misses a dose of her Atripla. She had a recent normal mammogram and is scheduled for her annual Pap smear next month with Dr. Stefano Gaul.  Preventive Screening-Counseling & Management  Alcohol-Tobacco     Alcohol drinks/day: <1     Alcohol type: wine     Smoking Status: never     Passive Smoke Exposure: no  Caffeine-Diet-Exercise     Caffeine use/day: no     Does Patient Exercise: yes     Type of exercise: gym     Exercise (avg: min/session): 30-60     Times/week: 3  Hep-HIV-STD-Contraception     HIV Risk: no risk noted  Safety-Violence-Falls     Seat Belt Use: yes  Comments: declined condoms      Sexual History:  /a.        Drug Use:  never.     Updated Prior Medication List: ATRIPLA 600-200-300 MG TABS (EFAVIRENZ-EMTRICITAB-TENOFOVIR) Take 1 tablet by mouth once a day * QUINAPRIL 25 MG HC MG  TABS (QUINAPRIL HCL) Take 1/2 tablet by mouth once a day * ALLERGY NASAL SPRAY   Current Allergies (reviewed today): No known allergies  Social History: Sexual History:  /a  Vital Signs:  Patient profile:   60 year old female Menstrual status:  postmenopausal Height:      65 inches (165.10 cm) Weight:      57.5 pounds (26.14 kg) BMI:     9.60 Temp:     98.0 degrees F (36.67 degrees C) oral Cuff size:   regular  Vitals Entered By: Jennet Maduro RN (November 02, 2009 11:04 AM) CC: follow-up visit, Pollen is bothering her nose Is Patient Diabetic? No Pain Assessment Patient in pain? no      Nutritional Status BMI of 25 - 29 = overweight Nutritional Status Detail appetite "good"  Have you ever been in a relationship where you felt threatened, hurt or afraid?No   Does patient need assistance? Functional Status Self care Ambulation Normal Comments no missed doses of rxes   Physical Exam  General:  alert and  well-nourished.   Mouth:  good dentition, pharynx pink and moist, no erythema, and no exudates.   Lungs:  normal breath sounds.  no crackles and no wheezes.   Heart:  normal rate, regular rhythm, and no murmur.      Impression & Recommendations:  Problem # 1:  HIV DISEASE (ICD-042) Her HIV infection remains under excellent control.  I will not make any changes today. Diagnostics Reviewed:  HIV: CDC-defined AIDS (09/04/2009)   CD4: 580 (10/20/2009)   WBC: 4.5 (10/19/2009)   Hgb: 12.1 (10/19/2009)   HCT: 36.9 (10/19/2009)   Platelets: 225 (10/19/2009) HIV-1 RNA: <48 copies/mL (10/19/2009)   HBSAg: No (09/10/2006)  Problem # 2:  DIABETES MELLITUS, TYPE II (ICD-250.00) She has mild hyperglycemia but tells me that her hemoglobin A1c has been less than 6 at Dr. Johnathan Hausen office. She is interested in trying to control her blood sugar with lifestyle modification rather than medications.  Medications Added to Medication List This Visit: 1)  Allergy Nasal Spray   Other Orders: Est. Patient Level III (14782) Future Orders: T-CD4SP ([facility]) (CD4SP) ... 05/01/2010 T-HIV Viral Load [phone]) ... 05/01/2010  Patient Instructions: 1)  Please schedule a follow-up appointment in 6 months.

## 2010-08-18 NOTE — Letter (Signed)
Summary: Pt. Assistance Program: AIDS  Pt. Assistance Program: AIDS   Imported By: Florinda Marker 05/30/2010 14:51:10  _____________________________________________________________________  External Attachment:    Type:   Image     Comment:   External Document

## 2010-08-18 NOTE — Miscellaneous (Signed)
Summary: [facility] Financial Update  Clinical Lists Changes  Observations: Added new observation of FINASSESSDT: 05/18/2010 (06/13/2010 10:18) Added new observation of HOUSEINCOME: 56213  (06/13/2010 10:18) Added new observation of YEARLYEXPEN: 650  (06/13/2010 10:18)

## 2010-08-18 NOTE — Miscellaneous (Signed)
Summary: Chloe Hancock Flowsheet update - PAP smear normal 01/2009  Clinical Lists Changes  Observations: Added new observation of PAP SMEAR: NEGATIVE (01/29/2009 8:43) Added new observation of LAST PAP DAT: 01/29/2009 (01/29/2009 8:43)

## 2010-08-18 NOTE — Miscellaneous (Signed)
Summary: RW Update  Clinical Lists Changes  Observations: Added new observation of YEARAIDSPOS: 1998  (09/04/2009 11:11) Added new observation of HIV STATUS: CDC-defined AIDS  (09/04/2009 11:11)

## 2010-10-05 LAB — T-HELPER CELL (CD4) - (RCID CLINIC ONLY)
CD4 % Helper T Cell: 26 % — ABNORMAL LOW (ref 33–55)
CD4 T Cell Abs: 580 uL (ref 400–2700)

## 2010-10-11 ENCOUNTER — Other Ambulatory Visit: Payer: Self-pay | Admitting: Obstetrics and Gynecology

## 2010-10-11 DIAGNOSIS — Z1231 Encounter for screening mammogram for malignant neoplasm of breast: Secondary | ICD-10-CM

## 2010-10-20 LAB — T-HELPER CELL (CD4) - (RCID CLINIC ONLY): CD4 T Cell Abs: 590 uL (ref 400–2700)

## 2010-10-27 LAB — T-HELPER CELL (CD4) - (RCID CLINIC ONLY)
CD4 % Helper T Cell: 26 % — ABNORMAL LOW (ref 33–55)
CD4 T Cell Abs: 580 uL (ref 400–2700)

## 2010-10-28 ENCOUNTER — Other Ambulatory Visit: Payer: Self-pay | Admitting: Family Medicine

## 2010-10-28 ENCOUNTER — Ambulatory Visit
Admission: RE | Admit: 2010-10-28 | Discharge: 2010-10-28 | Disposition: A | Discharge status: Home / Self Care | Payer: BC Managed Care – PPO | Source: Ambulatory Visit | Attending: Obstetrics and Gynecology | Admitting: Obstetrics and Gynecology

## 2010-10-28 ENCOUNTER — Ambulatory Visit
Admission: RE | Admit: 2010-10-28 | Discharge: 2010-10-28 | Disposition: A | Discharge status: Home / Self Care | Payer: BC Managed Care – PPO | Source: Ambulatory Visit | Attending: Family Medicine | Admitting: Family Medicine

## 2010-10-28 DIAGNOSIS — M542 Cervicalgia: Secondary | ICD-10-CM

## 2010-10-28 DIAGNOSIS — Z1231 Encounter for screening mammogram for malignant neoplasm of breast: Secondary | ICD-10-CM

## 2010-12-02 NOTE — Op Note (Signed)
NAME:  Chloe Hancock, Chloe Hancock                           ACCOUNT NO.:  [id number]   MEDICAL RECORD NO.:  [id number]                   PATIENT TYPE:  AMB   LOCATION:  ENDO                                 FACILITY:  [facility]   PHYSICIAN:  Graylin Shiver, M.D.                DATE OF BIRTH:  1951/01/25   DATE OF PROCEDURE:  06/15/2003  DATE OF DISCHARGE:                                 OPERATIVE REPORT   PROCEDURE:  Colonoscopy.   INDICATIONS:  Rectal bleeding.   Informed consent was obtained after explanation of the risks of bleeding,  injection, and perforation.   PREMEDICATION:  Fentanyl 85 mcg IV and Versed 8 mg IV.   DESCRIPTION OF PROCEDURE:  With the patient in the left lateral decubitus  position a rectal exam was performed and no masses were felt.  The Olympus  pediatric colonoscope was inserted into the rectum and advanced around the  colon to the cecum.  Cecal landmarks were identified.  The cecum and  ascending colon were normal.  The transverse colon was normal.  The  descending colon, sigmoid, and rectum were normal.  She tolerated the  procedure well without complications.   IMPRESSION:  Normal colonoscopy to the cecum.   I suspect that the patient's intermittent rectal bleeding is secondary to  some irritation in the anorectal area.  No abnormality is seen at this time.                                               Graylin Shiver, M.D.    Germain Osgood  D:  06/15/2003  T:  06/15/2003  Job:  [id number]   cc:   Melida Quitter, M.D.  [address]  [place]  [place] [address]  Fax: [phone]

## 2010-12-20 ENCOUNTER — Other Ambulatory Visit: Payer: Self-pay | Admitting: Internal Medicine

## 2010-12-20 ENCOUNTER — Other Ambulatory Visit: Payer: Self-pay | Admitting: Licensed Clinical Social Worker

## 2010-12-20 ENCOUNTER — Other Ambulatory Visit (INDEPENDENT_AMBULATORY_CARE_PROVIDER_SITE_OTHER): Payer: BC Managed Care – PPO

## 2010-12-20 DIAGNOSIS — B20 Human immunodeficiency virus [HIV] disease: Secondary | ICD-10-CM

## 2010-12-21 LAB — CBC WITH DIFFERENTIAL/PLATELET
Basophils Absolute: 0 10*3/uL (ref 0.0–0.1)
Eosinophils Absolute: 0 10*3/uL (ref 0.0–0.7)
Eosinophils Relative: 0 % (ref 0–5)
HCT: 36.6 % (ref 36.0–46.0)
Lymphocytes Relative: 49 % — ABNORMAL HIGH (ref 12–46)
MCH: 29.3 pg (ref 26.0–34.0)
MCHC: 32.2 g/dL (ref 30.0–36.0)
MCV: 90.8 fL (ref 78.0–100.0)
Monocytes Absolute: 0.3 10*3/uL (ref 0.1–1.0)
Platelets: 196 10*3/uL (ref 150–400)
RDW: 15 % (ref 11.5–15.5)

## 2010-12-21 LAB — COMPLETE METABOLIC PANEL WITH GFR
ALT: 15 U/L (ref 0–35)
AST: 18 U/L (ref 0–37)
BUN: 16 mg/dL (ref 6–23)
Calcium: 9.3 mg/dL (ref 8.4–10.5)
Chloride: 108 mEq/L (ref 96–112)
Creat: 0.61 mg/dL (ref 0.50–1.10)
GFR, Est African American: >60 mL/min (ref >60)
Total Bilirubin: 0.2 mg/dL — ABNORMAL LOW (ref 0.3–1.2)

## 2010-12-21 LAB — T-HELPER CELL (CD4) - (RCID CLINIC ONLY): CD4 T Cell Abs: 570 uL (ref 400–2700)

## 2010-12-22 LAB — HIV-1 RNA QUANT-NO REFLEX-BLD
HIV 1 RNA Quant: <20 copies/mL (ref <20)
HIV-1 RNA Quant, Log: <1.3 {Log} (ref <1.30)

## 2011-01-03 ENCOUNTER — Ambulatory Visit (INDEPENDENT_AMBULATORY_CARE_PROVIDER_SITE_OTHER): Payer: BC Managed Care – PPO | Admitting: Internal Medicine

## 2011-01-03 ENCOUNTER — Encounter: Payer: Self-pay | Admitting: Internal Medicine

## 2011-01-03 DIAGNOSIS — R739 Hyperglycemia, unspecified: Secondary | ICD-10-CM

## 2011-01-03 DIAGNOSIS — R7309 Other abnormal glucose: Secondary | ICD-10-CM

## 2011-01-03 DIAGNOSIS — IMO0002 Reserved for concepts with insufficient information to code with codable children: Secondary | ICD-10-CM

## 2011-01-03 DIAGNOSIS — B20 Human immunodeficiency virus [HIV] disease: Secondary | ICD-10-CM

## 2011-01-03 NOTE — Assessment & Plan Note (Signed)
I will make a referral to Jamison Neighbor, our diabetic nutritionist.

## 2011-01-03 NOTE — Assessment & Plan Note (Signed)
Her blood pressure is at goal. 

## 2011-01-03 NOTE — Progress Notes (Signed)
  Subjective:    Patient ID: Chloe Hancock, female    DOB: 1951/06/30, 60 y.o.   MRN: [id number]  HPI Susette is in for her routine visit. She has not missed any doses of her Atripla or blood pressure medication. She is feeling well and denies any new problems but she is concerned that when she saw Dr. Tiburcio Pea recently, her blood sugar was slightly elevated. She believes her hemoglobin A1c was 6.5. She states that she has been watching her diet and wants to do everything she can to bring her blood sugar back into the normal range.    Review of Systems     Objective:   Physical Exam  Constitutional: She appears well-developed and well-nourished.  HENT:  Mouth/Throat: Oropharynx is clear and moist. No oropharyngeal exudate.  Cardiovascular: Normal rate, regular rhythm and normal heart sounds.   No murmur heard. Pulmonary/Chest: Breath sounds normal. She has no wheezes. She has no rales.  Abdominal: Soft. Bowel sounds are normal. She exhibits no distension. There is no tenderness.  Skin: No rash noted.  Psychiatric: She has a normal mood and affect.          Assessment & Plan:

## 2011-01-03 NOTE — Assessment & Plan Note (Signed)
Her CD4 count is stable in the normal range at 570 and her viral load remains undetectable at less than 20. I will continue Atripla.

## 2011-01-03 NOTE — Progress Notes (Signed)
Addended by: Jennet Maduro D on: 01/03/2011 11:10 AM   Modules accepted: Orders

## 2011-01-04 ENCOUNTER — Telehealth: Payer: Self-pay | Admitting: Dietician

## 2011-01-04 NOTE — Telephone Encounter (Signed)
Patient called [facility] and verified she has 6 visits annually with 100% coverage for MNT. Year starts every July 1.

## 2011-01-06 ENCOUNTER — Ambulatory Visit (INDEPENDENT_AMBULATORY_CARE_PROVIDER_SITE_OTHER): Payer: BC Managed Care – PPO | Admitting: Dietician

## 2011-01-06 DIAGNOSIS — R7309 Other abnormal glucose: Secondary | ICD-10-CM

## 2011-01-06 DIAGNOSIS — E119 Type 2 diabetes mellitus without complications: Secondary | ICD-10-CM

## 2011-01-06 DIAGNOSIS — R739 Hyperglycemia, unspecified: Secondary | ICD-10-CM

## 2011-01-06 MED ORDER — GLUCOSE BLOOD VI STRP: ORAL_STRIP | Status: AC

## 2011-01-06 MED ORDER — BL LANCETS MISC: Status: DC

## 2011-01-06 NOTE — Patient Instructions (Signed)
NEXT appointment: July 6th at 10:30 AM- bring meter and records

## 2011-01-16 NOTE — Progress Notes (Signed)
Medical Nutrition Therapy:  Appt start time: 1100 end time:  1200.  Assessment:  Primary concerns today: Blood sugar control  Usual eating pattern includes Meal 3 and 2+ snacks per day.      Avoided foods include: white flour & sugar, cookies and candy, canned fruit, soda, french fries, sweetened cereal and ice cream and desserts.     Usual physical activity includes walking 30 minutes a day 3 days per week  Diagnosis and Intervention:  Progress Towards Goal(s):  In progress   Nutritional Diagnosis:  NB-1.4 Self-monitoring deficit As related to not monitoring blood sugars.  As evidenced by not having a meter or knowing now to use or how to interpret data. NB-2.1 Physical inactivity As related to needing consistent activity on a daily basis to control blood sugar and decrease risk of diabetes.  As evidenced by patient reprot of sedenatry ajob and wlks 2-3 times a week.    Monitoring/Evaluation:  Dietary intake and blood sugar records in 3 week(s)

## 2011-01-20 ENCOUNTER — Ambulatory Visit (INDEPENDENT_AMBULATORY_CARE_PROVIDER_SITE_OTHER): Payer: BC Managed Care – PPO | Admitting: Dietician

## 2011-01-20 DIAGNOSIS — R739 Hyperglycemia, unspecified: Secondary | ICD-10-CM

## 2011-01-20 DIAGNOSIS — R7309 Other abnormal glucose: Secondary | ICD-10-CM

## 2011-01-20 DIAGNOSIS — E119 Type 2 diabetes mellitus without complications: Secondary | ICD-10-CM

## 2011-01-20 NOTE — Progress Notes (Signed)
Medical Nutrition Therapy:  Appt start time: 1030 end time:  1115.  Assessment:  Primary concerns today: Blood sugar control  Usual eating pattern includes Meal 3 and 3 snacks per day. Checks blood sugars 3 times daily.  Currently consuming approximately 176- 190 grams of CHO/day. Two hour post prandial blood sugars 120-137 X 3 days.   Avoided foods include: white flour & sugar, cookies and candy, canned fruit, soda, french fires, sweetened cereal, ice cream, and desserts .     Usual physical activity includes walking 30 minutes a day 3 days per week in her house via video. Pt also goes to the gym 1-2 times per week.  Pt enjoys zumba and aerobic exercises.  Pt stated she performs physical activity 4-5 times a week.  Pt was lent a pedometer and shown how to operate it. Pt remains interested in weight loss.    Progress Towards Goal(s):  Some progress   Nutritional Diagnosis:  NB- 1.4 Self- monitoring deficit as related to not monitoring blood sugars resolved and is now related to physical activity, as evidenced by not being able to quantify the amount and intensity of physical activity performed daily.  NB- 2.1 Physical inactivity as related to needing consistent activity on a daily basiis to control blood sugar and decrease risk of diabetes as evidenced by patient report or sedentary job and walks 2-3 times per week, resolved.    Intervention:  1.  Provided further nutrition education and counseling on carbohydrate counting, portion control, and label reading.  2.  Reviewed pt's food record and blood sugar levels.  3.  Answered patients questions regarding alcohol consumption, incorporating healthy desserts, and dry vs. liquid measuring methods. 4.  Discussed the importance of physical activity and its role in lowering blood sugar levels and weight loss. Stressed a slow weight loss goal of  0.5-1 pound per week.    Monitoring/Evaluation: Continue to monitor dietary intake as needed.  Monitor  physical activity with pedometer and physical activity log for 3 weeks.

## 2011-01-20 NOTE — Patient Instructions (Signed)
Please make a follow up in 3 weeks  Great job keeping food records!  I look forward to seeing your activity records at our next visit.

## 2011-02-10 ENCOUNTER — Ambulatory Visit: Payer: BC Managed Care – PPO | Admitting: Dietician

## 2011-02-17 ENCOUNTER — Ambulatory Visit (INDEPENDENT_AMBULATORY_CARE_PROVIDER_SITE_OTHER): Payer: BC Managed Care – PPO | Admitting: Dietician

## 2011-02-17 DIAGNOSIS — R739 Hyperglycemia, unspecified: Secondary | ICD-10-CM

## 2011-02-17 DIAGNOSIS — E119 Type 2 diabetes mellitus without complications: Secondary | ICD-10-CM

## 2011-02-17 NOTE — Progress Notes (Signed)
Medical Nutrition Therapy:  Appt start time: 1125 end time:  1205.  Assessment:  Primary concerns today: Blood sugar control  Usual eating pattern includes Meal 3 and 3 snacks per day. Checks blood sugars 2 times daily post prandially: 99-120. Blood sugars are improved.  To decrease to 3 times a week. Kept 3 weeks activity log: Currently getting about 4400 steps a day and 3x/week resistance training. Weight decreased 1.6# in past 4 weeks.    Progress Towards Goal(s):  Continues to make progress   Nutritional Diagnosis:  NB- 1.4 Self- monitoring deficit improving as related to not monitoring physical activity to increase activity to maintain weight loss and increase HDL.   [place] 2.2 altered nutrition related laboratory value as related to cardiovascular risk associated with prediabetes as evidenced by LDL of 130.  Intervention:  1.  Provided nutrition education and counseling on lowering LDL and increasing HDL.  2.  Reviewed pt's food record and blood sugar levels.  3.  Re-emphasized  the importance of physical activity and its role in increasing HDL and weight loss. 4- Reviewed her current laboratory and anthropometric values in relation to her goals.    Monitoring/Evaluation: Continue to monitor dietary intake, Monitor physical activity with pedometer and make other lifestyle changes per patient instructions.

## 2011-02-17 NOTE — Patient Instructions (Signed)
Increase my activity eventually to 10,000 steps a day, but in the next 6 weeks to at least 5000 steps a day on average.  Start working on lowering my LDL- look at sheets and pick one thing to do differently  Continue to loose weight. Watch on your home scale.  See you in 6 weeks on 04/07/11 @ 11:30 am

## 2011-04-07 ENCOUNTER — Ambulatory Visit (INDEPENDENT_AMBULATORY_CARE_PROVIDER_SITE_OTHER): Payer: BC Managed Care – PPO | Admitting: Dietician

## 2011-04-07 DIAGNOSIS — R7309 Other abnormal glucose: Secondary | ICD-10-CM

## 2011-04-07 DIAGNOSIS — R739 Hyperglycemia, unspecified: Secondary | ICD-10-CM

## 2011-04-07 DIAGNOSIS — E119 Type 2 diabetes mellitus without complications: Secondary | ICD-10-CM

## 2011-04-07 LAB — T-HELPER CELL (CD4) - (RCID CLINIC ONLY)
CD4 % Helper T Cell: 26 — ABNORMAL LOW
CD4 T Cell Abs: 510

## 2011-04-07 NOTE — Progress Notes (Signed)
Medical Nutrition Therapy:  Appt start time: 1140 end time:  1215.  Assessment:  Primary concerns today: Blood sugar control  Usual eating pattern includes Meal 3 and 3 snacks per day. Reports she has memorized portions of most foods. Checks blood sugars 0-3 times a week. Using Benecol and [place] best eggs. Blood sugar after apple today was 115 in office.   Reports 17,000 steps most days and goes to gym for weight training. Wears pedometer daily to track. Weight decreased 4# in past 4 weeks.    Progress Towards Goal(s):  Continues to make progress   Nutritional Diagnosis:  NB- 1.4 Self- monitoring deficit improving as related to not monitoring physical activity to increase activity to maintain weight loss and increase HDL.   [place] 2.2 altered nutrition related laboratory value as related to cardiovascular risk associated with prediabetes pending repeat labs.  Intervention:  1. Discussed action and maintenance aspects of continuing healthy behaviors  2. Provided social support and assisted patient in thinking about her own support system. 3. Education about Alpha Lipoic acid supplements.   Monitoring/Evaluation: Continue to monitor dietary intake, Monitor physical activity with pedometer, weight at least weekly.  Follow up in 3 months.

## 2011-04-07 NOTE — Patient Instructions (Signed)
Great work!!! With weight loss and exercising!!!  Let's meet again in 3 months.

## 2011-06-16 ENCOUNTER — Other Ambulatory Visit: Payer: Self-pay | Admitting: Internal Medicine

## 2011-06-16 ENCOUNTER — Other Ambulatory Visit: Payer: BC Managed Care – PPO

## 2011-06-16 ENCOUNTER — Ambulatory Visit (INDEPENDENT_AMBULATORY_CARE_PROVIDER_SITE_OTHER): Payer: BC Managed Care – PPO | Admitting: *Deleted

## 2011-06-16 DIAGNOSIS — Z23 Encounter for immunization: Secondary | ICD-10-CM

## 2011-06-16 DIAGNOSIS — B20 Human immunodeficiency virus [HIV] disease: Secondary | ICD-10-CM

## 2011-06-16 LAB — COMPREHENSIVE METABOLIC PANEL
Albumin: 4.2 g/dL (ref 3.5–5.2)
BUN: 16 mg/dL (ref 6–23)
CO2: 28 mEq/L (ref 19–32)
Calcium: 9.1 mg/dL (ref 8.4–10.5)
Chloride: 107 mEq/L (ref 96–112)
Glucose, Bld: 92 mg/dL (ref 70–99)
Potassium: 4.7 mEq/L (ref 3.5–5.3)
Sodium: 142 mEq/L (ref 135–145)
Total Protein: 6.7 g/dL (ref 6.0–8.3)

## 2011-06-16 LAB — LIPID PANEL
Cholesterol: 195 mg/dL (ref 0–200)
HDL: 49 mg/dL (ref >39)
LDL Cholesterol: 132 mg/dL — ABNORMAL HIGH (ref 0–99)
Triglycerides: 68 mg/dL (ref <150)

## 2011-06-16 LAB — CBC
HCT: 36.2 % (ref 36.0–46.0)
Hemoglobin: 11.8 g/dL — ABNORMAL LOW (ref 12.0–15.0)
RBC: 4.02 MIL/uL (ref 3.87–5.11)

## 2011-06-16 LAB — T-HELPER CELL (CD4) - (RCID CLINIC ONLY): CD4 % Helper T Cell: 26 % — ABNORMAL LOW (ref 33–55)

## 2011-06-19 LAB — HIV-1 RNA QUANT-NO REFLEX-BLD
HIV 1 RNA Quant: <20 copies/mL (ref <20)
HIV-1 RNA Quant, Log: <1.3 {Log} (ref <1.30)

## 2011-06-29 ENCOUNTER — Encounter: Payer: Self-pay | Admitting: Internal Medicine

## 2011-06-29 ENCOUNTER — Ambulatory Visit (INDEPENDENT_AMBULATORY_CARE_PROVIDER_SITE_OTHER): Payer: BC Managed Care – PPO | Admitting: Internal Medicine

## 2011-06-29 VITALS — BP 149/79 | HR 84 | Temp 97.6°F | Ht 65.5 in | Wt 147.0 lb

## 2011-06-29 DIAGNOSIS — B20 Human immunodeficiency virus [HIV] disease: Secondary | ICD-10-CM

## 2011-06-29 NOTE — Progress Notes (Signed)
Patient ID: Chloe Hancock, female   DOB: 04/16/1951, 60 y.o.   MRN: [id number] INFECTIOUS DISEASE PROGRESS NOTE    Subjective: Chloe Hancock is in for her routine visit. She states that she is doing well and has no new problems. She does have questions for me today that she wants to discuss about recent lab work that was done during her annual physical exam with Dr. Tiburcio Pea. Specifically, she notes that her lab work showed that her hematocrit and creatinine were slightly below normal and that her LDL cholesterol was elevated. She was pleased that her glucose  has come under better control and her hemoglobin A1c has fallen. She states that she's been working very hard on lifestyle modification.As usual, she has not missed a single dose of her Atripla.  Objective:  General:  she appears healthy and happy. Skin:  she has no skin rash  Lungs: Clear Cor: regular S1 and S2 no murmurs  Abdomen: soft and nontender    Lab Results Lab Results  Component Value Date   WBC 4.4 06/16/2011   HGB 11.8* 06/16/2011   HCT 36.2 06/16/2011   MCV 90.0 06/16/2011   PLT 199 06/16/2011    Lab Results  Component Value Date   CREATININE 0.60 06/16/2011   BUN 16 06/16/2011   NA 142 06/16/2011   K 4.7 06/16/2011   CL 107 06/16/2011   CO2 28 06/16/2011    Lab Results  Component Value Date   ALT 17 06/16/2011   AST 20 06/16/2011   ALKPHOS 67 06/16/2011   BILITOT 0.2* 06/16/2011    HIV 1 RNA Quant (copies/mL)  Date Value  06/16/2011 <20   12/20/2010 <20   05/18/2010 <20 copies/mL      CD4 T Cell Abs (cmm)  Date Value  06/16/2011 530   12/20/2010 570   05/18/2010 500    Assessment: Her HIV infection remains under excellent control. I will continue her current regimen. I informed her that her slightly low hemoglobin is probably do to her antiretroviral therapy in that this is not clinically significant. A slightly low creatinine is also not clinically significant. I talked to her about lifestyle modification,  specifically an American Heart Association, low-fat diet to address her elevated LDL cholesterol. Her overall risk for premature cardiovascular disease is relatively low.   Plan: 1. Continue Atripla.  2. Consider addition of low-fat diet to her current ongoing lifestyle modification efforts  3. Followup visit in 6 months after repeat lab work.    Cliffton Asters, MD [facility], [facility] [phone] pager   [phone] cell 06/29/2011, 1:06 PM

## 2011-07-03 ENCOUNTER — Other Ambulatory Visit: Payer: Self-pay | Admitting: *Deleted

## 2011-07-03 DIAGNOSIS — B20 Human immunodeficiency virus [HIV] disease: Secondary | ICD-10-CM

## 2011-07-03 MED ORDER — EFAVIRENZ-EMTRICITAB-TENOFOVIR 600-200-300 MG PO TABS: 1.0000 | ORAL_TABLET | Freq: Every day | ORAL | Status: DC

## 2011-07-04 ENCOUNTER — Ambulatory Visit: Payer: BC Managed Care – PPO | Admitting: Internal Medicine

## 2011-07-07 ENCOUNTER — Ambulatory Visit: Payer: BC Managed Care – PPO | Admitting: Dietician

## 2011-07-13 ENCOUNTER — Ambulatory Visit (INDEPENDENT_AMBULATORY_CARE_PROVIDER_SITE_OTHER): Payer: BC Managed Care – PPO | Admitting: Dietician

## 2011-07-13 DIAGNOSIS — R7309 Other abnormal glucose: Secondary | ICD-10-CM

## 2011-07-13 DIAGNOSIS — E119 Type 2 diabetes mellitus without complications: Secondary | ICD-10-CM

## 2011-07-13 DIAGNOSIS — R739 Hyperglycemia, unspecified: Secondary | ICD-10-CM

## 2011-07-13 NOTE — Patient Instructions (Signed)
Great work! Weight was 147.3# today.   You want to work on to lower your LDL: 1- lower cholesterol intake by using egg whites 2-  increasing viscous/ soluble fiber- start reading it on labels- can use barley, oatmeal, rye or oatmeal bread bread. 3- Stanols- try the chews instead of margarine?   Follow up in March 2013.

## 2011-07-13 NOTE — Progress Notes (Signed)
Medical Nutrition Therapy:  Appt start time: 1100 end time:  1140.  Assessment:  Primary concerns today: Blood sugar control and LDL  Usual eating pattern includes Meal 3 and 3 snacks per day. Reports being active and eating healthy is now routine for her. Her A1C is 5.5% at her doctor's office. Weight 147.3# with BMI 24.2. Is still using Benecol and [place] best eggs.  Weight decreased 3.3# in past 12 weeks.    Progress Towards Goal(s):  Continues to make progress   Nutritional Diagnosis:  NB- 1.4 Self- monitoring deficit resolved           as evidenced by patient report of still wearing pedometer and going to gym on a regular basis and HDL improved.   [place] 2.2 altered nutrition related laboratory value as related to cardiovascular risk associated with prediabetes resolved per patient report of A1C of 5.5%.  NI 5.6.3 In appropriate intake of cholesterol as related in elevated LDL cholesterol as evidenced by her report of eating eggs daily and other sources of cholesterol.  Intervention:  1. Discussed action and maintenance aspects of continuing healthy behaviors  2. Education about dietary methods to decrease dietary cholesterol and LDL.  3. Education about Reasonable body weight using ideal body weight calculations and non diabetic range for A1C.   Monitoring/Evaluation: Continue to monitor dietary intake, Monitor physical activity with pedometer, weight at least weekly, lower dietary cholesterol, increase vicsous fiber and try soy products.  Follow up in 3 months.

## 2011-08-04 ENCOUNTER — Telehealth: Payer: Self-pay | Admitting: Dietician

## 2011-08-04 NOTE — Telephone Encounter (Signed)
Called [employer] to verify coverage for MNT. Was told patient's plan covers  4 visits with 30$ copays if she does not have a diagnosis of diabetes and 6 visits with 100% coverage if she does have a diagnosis of diabetes.  Spoke with patient who says she does not think she has diabetes, but is trying to prevent diabetes.(Note reported A1C of 6.5% done at another lab.)  Made plans for follow up in July 2013; her new calendar year with [employer].

## 2011-10-02 ENCOUNTER — Other Ambulatory Visit: Payer: Self-pay | Admitting: Obstetrics and Gynecology

## 2011-10-02 DIAGNOSIS — Z1231 Encounter for screening mammogram for malignant neoplasm of breast: Secondary | ICD-10-CM

## 2011-11-01 ENCOUNTER — Ambulatory Visit
Admission: RE | Admit: 2011-11-01 | Discharge: 2011-11-01 | Disposition: A | Discharge status: Home / Self Care | Payer: BC Managed Care – PPO | Source: Ambulatory Visit | Attending: Obstetrics and Gynecology | Admitting: Obstetrics and Gynecology

## 2011-11-01 DIAGNOSIS — Z1231 Encounter for screening mammogram for malignant neoplasm of breast: Secondary | ICD-10-CM

## 2011-11-20 ENCOUNTER — Ambulatory Visit
Admission: RE | Admit: 2011-11-20 | Discharge: 2011-11-20 | Disposition: A | Discharge status: Home / Self Care | Payer: BC Managed Care – PPO | Source: Ambulatory Visit | Attending: Family Medicine | Admitting: Family Medicine

## 2011-11-20 ENCOUNTER — Other Ambulatory Visit: Payer: Self-pay | Admitting: Family Medicine

## 2011-11-20 DIAGNOSIS — S139XXA Sprain of joints and ligaments of unspecified parts of neck, initial encounter: Secondary | ICD-10-CM

## 2011-12-19 ENCOUNTER — Encounter: Payer: Self-pay | Admitting: Obstetrics and Gynecology

## 2011-12-27 ENCOUNTER — Telehealth: Payer: Self-pay | Admitting: *Deleted

## 2011-12-27 NOTE — Telephone Encounter (Signed)
RN requested that the pt have her PAP smear results faxed from her practitioner, [facility].  Pt stated that her next appt is in Aug. 2013 and that she would request results be faxed.

## 2011-12-28 ENCOUNTER — Other Ambulatory Visit: Payer: BC Managed Care – PPO

## 2011-12-28 DIAGNOSIS — B20 Human immunodeficiency virus [HIV] disease: Secondary | ICD-10-CM

## 2011-12-29 LAB — CBC
HCT: 37.3 % (ref 36.0–46.0)
MCHC: 33 g/dL (ref 30.0–36.0)
MCV: 89.4 fL (ref 78.0–100.0)
RDW: 14.4 % (ref 11.5–15.5)

## 2011-12-29 LAB — LIPID PANEL
HDL: 51 mg/dL (ref >39)
LDL Cholesterol: 118 mg/dL — ABNORMAL HIGH (ref 0–99)
Total CHOL/HDL Ratio: 4.1 Ratio
VLDL: 39 mg/dL (ref 0–40)

## 2011-12-29 LAB — COMPREHENSIVE METABOLIC PANEL
AST: 19 U/L (ref 0–37)
Alkaline Phosphatase: 67 U/L (ref 39–117)
BUN: 14 mg/dL (ref 6–23)
Creat: 0.72 mg/dL (ref 0.50–1.10)
Potassium: 4.7 mEq/L (ref 3.5–5.3)
Total Bilirubin: 0.3 mg/dL (ref 0.3–1.2)

## 2011-12-29 LAB — RPR

## 2012-01-01 LAB — HIV-1 RNA QUANT-NO REFLEX-BLD: HIV-1 RNA Quant, Log: <1.3 {Log} (ref <1.30)

## 2012-01-11 ENCOUNTER — Encounter: Payer: Self-pay | Admitting: Internal Medicine

## 2012-01-11 ENCOUNTER — Ambulatory Visit (INDEPENDENT_AMBULATORY_CARE_PROVIDER_SITE_OTHER): Payer: BC Managed Care – PPO | Admitting: Internal Medicine

## 2012-01-11 VITALS — BP 133/79 | HR 87 | Temp 97.9°F | Wt 154.0 lb

## 2012-01-11 DIAGNOSIS — B20 Human immunodeficiency virus [HIV] disease: Secondary | ICD-10-CM

## 2012-01-11 NOTE — Progress Notes (Signed)
Patient ID: Chloe Hancock, female   DOB: 02-12-1951, 61 y.o.   MRN: [id number]     [facility] for Infectious Disease  Patient Active Problem List  Diagnosis  . HIV DISEASE  . DIABETES MELLITUS, TYPE II  . ALLERGIC RHINITIS  . HEADACHE  . HYPERTENSION NEC  . Hyperglycemia    Patient's Medications  New Prescriptions   No medications on file  Previous Medications   BL LANCETS MISC    [place] to check blood sugar up to 2x daily   EFAVIRENZ-EMTRICTABINE-TENOFOVIR (ATRIPLA) 600-200-300 MG PER TABLET    Take 1 tablet by mouth daily.   FISH OIL-OMEGA-3 FATTY ACIDS 1000 MG CAPSULE    Take 1 g by mouth daily.     GLUCOSE BLOOD TEST STRIP    Use as instructed up to 2 x daily   MULTIPLE VITAMINS-MINERALS (MULTIVITAMIN & MINERAL PO)    Take by mouth.     QUINAPRIL-HYDROCHLOROTHIAZIDE (ACCURETIC) 20-25 MG PER TABLET    Take 0.5 tablets by mouth daily.    Modified Medications   No medications on file  Discontinued Medications   No medications on file    Subjective: Chloe Hancock is in for her routine visit. As usual she never misses a single dose of her Atripla. She is doing well without complaints.  Objective: Temp: 97.9 F (36.6 C) (06/27 1432) Temp src: Oral (06/27 1432) BP: 133/79 mmHg (06/27 1432) Pulse Rate: 87  (06/27 1432)  General: She is in good spirits Skin: No rash Lungs: Clear Cor: Regular S1 and S2 no murmurs  Lab Results HIV 1 RNA Quant (copies/mL)  Date Value  12/28/2011 <20   06/16/2011 <20   12/20/2010 <20      CD4 T Cell Abs (cmm)  Date Value  12/28/2011 630   06/16/2011 530   12/20/2010 570    Lab Results  Component Value Date   CHOL 208* 12/28/2011   HDL 51 12/28/2011   LDLCALC 118* 12/28/2011   TRIG 194* 12/28/2011   CHOLHDL 4.1 12/28/2011      Assessment: Her HIV infection remains under excellent control. I will continue Atripla.  She has mild dyslipidemia.  Plan: 1. Continue Atripla 2. We'll send copies of this note with her lab work to her primary  care physician, Dr. Leonides Sake, so he can review lipid management at her next visit 3. Followup here after lab work in 6 months   Cliffton Asters, MD [facility] for Infectious Disease [facility] [phone] pager   [phone] cell 01/11/2012, 2:48 PM

## 2012-02-06 ENCOUNTER — Other Ambulatory Visit: Payer: Self-pay | Admitting: Internal Medicine

## 2012-03-15 ENCOUNTER — Encounter: Payer: Self-pay | Admitting: Obstetrics and Gynecology

## 2012-03-15 ENCOUNTER — Ambulatory Visit (INDEPENDENT_AMBULATORY_CARE_PROVIDER_SITE_OTHER): Payer: BC Managed Care – PPO | Admitting: Obstetrics and Gynecology

## 2012-03-15 VITALS — BP 118/62 | Ht 66.0 in | Wt 154.0 lb

## 2012-03-15 DIAGNOSIS — Z01419 Encounter for gynecological examination (general) (routine) without abnormal findings: Secondary | ICD-10-CM

## 2012-03-15 DIAGNOSIS — Z124 Encounter for screening for malignant neoplasm of cervix: Secondary | ICD-10-CM

## 2012-03-15 DIAGNOSIS — N926 Irregular menstruation, unspecified: Secondary | ICD-10-CM | POA: Insufficient documentation

## 2012-03-15 NOTE — Progress Notes (Signed)
Subjective:  The patient is not taking hormone replacement therapy The patient  is not taking a Calcium supplement. Post-menopausal bleeding:no  Last Pap: approximate date 03/16/2011 and was normal  Last mammogram: approximate date 10/2011 and was normal Last DEXA scan : 03/02/2010 Last colonoscopy:normal   Urinary symptoms: none Normal bowel movements: Yes Reports abuse at home: No: .    Chloe Hancock is a 61 y.o. female G2P2 who presents for annual exam. Known cystocele The patient has no complaints today.   The following portions of the patient's history were reviewed and updated as appropriate: allergies, current medications, past family history, past medical history, past social history, past surgical history and problem list.  Review of Systems Pertinent items are noted in HPI. Gastrointestinal:No change in bowel habits, no abdominal pain, no rectal bleeding Genitourinary:negative for dysuria, frequency, hematuria, nocturia and urinary incontinence    Objective:     BP 118/62  Ht 5\' 6"  (1.676 m)  Wt 154 lb (69.854 kg)  BMI 24.86 kg/m2  Weight:  Wt Readings from Last 1 Encounters:  03/15/12 154 lb (69.854 kg)     BMI: Body mass index is 24.86 kg/(m^2). General Appearance: Alert, appropriate appearance for age. No acute distress HEENT: Grossly normal Neck / Thyroid: Supple, no masses, nodes or enlargement Lungs: clear to auscultation bilaterally Back: No CVA tenderness Breast Exam: No masses or nodes.No dimpling, nipple retraction or discharge. Cardiovascular: Regular rate and rhythm. S1, S2, no murmur Gastrointestinal: Soft, non-tender, no masses or organomegaly Pelvic Exam: Vulva and vagina appear normal. Bimanual exam reveals normal uterus and adnexa. Rectovaginal: normal rectal, no masses Lymphatic Exam: Non-palpable nodes in neck, clavicular, axillary, or inguinal regions Skin: no rash or abnormalities Neurologic: Normal gait and speech, no tremor    Psychiatric: Alert and oriented, appropriate affect.     Assessment:    Normal gyn exam    Plan:   mammogram pap smear return annually or prn Follow-up:  for annual exam

## 2012-03-15 NOTE — Addendum Note (Signed)
Addended by: Lerry Liner D on: 03/15/2012 04:41 PM   Modules accepted: Orders

## 2012-03-20 LAB — PAP IG W/ RFLX HPV ASCU

## 2012-03-28 ENCOUNTER — Ambulatory Visit: Payer: Self-pay | Admitting: Obstetrics and Gynecology

## 2012-06-03 ENCOUNTER — Other Ambulatory Visit: Payer: BC Managed Care – PPO

## 2012-06-03 DIAGNOSIS — B20 Human immunodeficiency virus [HIV] disease: Secondary | ICD-10-CM

## 2012-06-03 LAB — CBC
MCH: 29.5 pg (ref 26.0–34.0)
MCHC: 34.6 g/dL (ref 30.0–36.0)
Platelets: 225 10*3/uL (ref 150–400)

## 2012-06-03 LAB — COMPREHENSIVE METABOLIC PANEL
ALT: 15 U/L (ref 0–35)
AST: 19 U/L (ref 0–37)
Albumin: 4.3 g/dL (ref 3.5–5.2)
Alkaline Phosphatase: 61 U/L (ref 39–117)
Potassium: 4.1 mEq/L (ref 3.5–5.3)
Sodium: 140 mEq/L (ref 135–145)
Total Protein: 7 g/dL (ref 6.0–8.3)

## 2012-06-03 LAB — LIPID PANEL
Total CHOL/HDL Ratio: 3.7 Ratio
VLDL: 14 mg/dL (ref 0–40)

## 2012-06-04 LAB — HIV-1 RNA QUANT-NO REFLEX-BLD: HIV 1 RNA Quant: <20 copies/mL (ref <20)

## 2012-06-20 ENCOUNTER — Other Ambulatory Visit: Payer: BC Managed Care – PPO

## 2012-06-25 ENCOUNTER — Ambulatory Visit: Payer: BC Managed Care – PPO | Admitting: Internal Medicine

## 2012-06-27 ENCOUNTER — Encounter: Payer: Self-pay | Admitting: Internal Medicine

## 2012-06-27 ENCOUNTER — Ambulatory Visit (INDEPENDENT_AMBULATORY_CARE_PROVIDER_SITE_OTHER): Payer: BC Managed Care – PPO | Admitting: Internal Medicine

## 2012-06-27 VITALS — BP 147/77 | HR 79 | Temp 97.5°F | Ht 65.5 in | Wt 147.2 lb

## 2012-06-27 DIAGNOSIS — Z23 Encounter for immunization: Secondary | ICD-10-CM

## 2012-06-27 DIAGNOSIS — Z0000 Encounter for general adult medical examination without abnormal findings: Secondary | ICD-10-CM

## 2012-06-27 DIAGNOSIS — B20 Human immunodeficiency virus [HIV] disease: Secondary | ICD-10-CM

## 2012-06-27 NOTE — Progress Notes (Signed)
Patient ID: Chloe Hancock, female   DOB: Nov 15, 1950, 61 y.o.   MRN: [id number]     [facility] for Infectious Disease  Patient Active Problem List  Diagnosis  . HIV DISEASE  . DIABETES MELLITUS, TYPE II  . ALLERGIC RHINITIS  . HEADACHE  . HYPERTENSION NEC  . Hyperglycemia  . Irregular bleeding    Patient's Medications  New Prescriptions   No medications on file  Previous Medications   ATRIPLA 600-200-300 MG PER TABLET    TAKE ONE TABLET BY MOUTH EVERY DAY   BL LANCETS MISC    [place] to check blood sugar up to 2x daily   FISH OIL-OMEGA-3 FATTY ACIDS 1000 MG CAPSULE    Take 1 g by mouth daily.     GLUCOSE BLOOD TEST STRIP    Use as instructed up to 2 x daily   MULTIPLE VITAMINS-MINERALS (MULTIVITAMIN & MINERAL PO)    Take by mouth.     QUINAPRIL-HYDROCHLOROTHIAZIDE (ACCURETIC) 20-25 MG PER TABLET    Take 0.5 tablets by mouth daily.    Modified Medications   No medications on file  Discontinued Medications   No medications on file    Subjective: Chloe Hancock is in for her routine visit. As usual, she never misses a single dose of her Atripla. She had a nice Thanksgiving visit in [place] with her family but did not enjoy the 15 Hour drive back home. She plans on having a quiet Christmas holiday he in [place]. She has received her influenza vaccine.  Objective: Temp: 97.5 F (36.4 C) (12/12 0955) Temp src: Oral (12/12 0955) BP: 147/77 mmHg (12/12 0955) Pulse Rate: 79  (12/12 0955)  General: She is in good spirits Skin: No rash Lungs: Clear Cor: Regular S1 and S2 no murmurs  Lab Results HIV 1 RNA Quant (copies/mL)  Date Value  06/03/2012 <20   12/28/2011 <20   06/16/2011 <20      CD4 T Cell Abs (cmm)  Date Value  06/03/2012 590   12/28/2011 630   06/16/2011 530      Assessment: Her infection remains under excellent control.  Plan: 1. Continue Atripla 2. TDAP booster given 3. Followup after lab work in 6 months   Cliffton Asters, MD [facility] for  Infectious Disease [facility] [phone] pager   [phone] cell 06/27/2012, 10:17 AM

## 2012-06-27 NOTE — Addendum Note (Signed)
Addended by: Jennet Maduro D on: 06/27/2012 10:28 AM   Modules accepted: Orders

## 2012-07-04 ENCOUNTER — Ambulatory Visit: Payer: BC Managed Care – PPO | Admitting: Internal Medicine

## 2012-10-03 ENCOUNTER — Other Ambulatory Visit: Payer: Self-pay

## 2012-10-03 DIAGNOSIS — Z1231 Encounter for screening mammogram for malignant neoplasm of breast: Secondary | ICD-10-CM

## 2012-11-01 ENCOUNTER — Ambulatory Visit
Admission: RE | Admit: 2012-11-01 | Discharge: 2012-11-01 | Disposition: A | Discharge status: Home / Self Care | Payer: BC Managed Care – PPO | Source: Ambulatory Visit

## 2012-11-01 DIAGNOSIS — Z1231 Encounter for screening mammogram for malignant neoplasm of breast: Secondary | ICD-10-CM

## 2012-11-07 ENCOUNTER — Other Ambulatory Visit: Payer: Self-pay | Admitting: Internal Medicine

## 2012-12-11 ENCOUNTER — Other Ambulatory Visit: Payer: Self-pay | Admitting: Internal Medicine

## 2012-12-24 ENCOUNTER — Other Ambulatory Visit: Payer: BC Managed Care – PPO

## 2012-12-24 DIAGNOSIS — B20 Human immunodeficiency virus [HIV] disease: Secondary | ICD-10-CM

## 2012-12-24 LAB — LIPID PANEL
Cholesterol: 205 mg/dL — ABNORMAL HIGH (ref 0–200)
LDL Cholesterol: 132 mg/dL — ABNORMAL HIGH (ref 0–99)
Total CHOL/HDL Ratio: 3.9 Ratio
VLDL: 21 mg/dL (ref 0–40)

## 2012-12-24 LAB — COMPREHENSIVE METABOLIC PANEL
ALT: 12 U/L (ref 0–35)
AST: 17 U/L (ref 0–37)
Alkaline Phosphatase: 80 U/L (ref 39–117)
CO2: 25 mEq/L (ref 19–32)
Sodium: 140 mEq/L (ref 135–145)
Total Bilirubin: 0.2 mg/dL — ABNORMAL LOW (ref 0.3–1.2)
Total Protein: 6.6 g/dL (ref 6.0–8.3)

## 2012-12-24 LAB — CBC
Hemoglobin: 12 g/dL (ref 12.0–15.0)
MCH: 27.5 pg (ref 26.0–34.0)
Platelets: 225 10*3/uL (ref 150–400)
RBC: 4.37 MIL/uL (ref 3.87–5.11)
RDW: 15.5 % (ref 11.5–15.5)
WBC: 4 10*3/uL (ref 4.0–10.5)

## 2012-12-24 LAB — RPR

## 2012-12-25 LAB — T-HELPER CELL (CD4) - (RCID CLINIC ONLY)
CD4 % Helper T Cell: 26 % — ABNORMAL LOW (ref 33–55)
CD4 T Cell Abs: 500 uL (ref 400–2700)

## 2012-12-25 LAB — HIV-1 RNA QUANT-NO REFLEX-BLD: HIV 1 RNA Quant: <20 copies/mL (ref <20)

## 2013-01-07 ENCOUNTER — Ambulatory Visit (INDEPENDENT_AMBULATORY_CARE_PROVIDER_SITE_OTHER): Payer: BC Managed Care – PPO | Admitting: Internal Medicine

## 2013-01-07 ENCOUNTER — Encounter: Payer: Self-pay | Admitting: Internal Medicine

## 2013-01-07 VITALS — BP 125/76 | HR 96 | Temp 98.0°F | Ht 65.5 in | Wt 157.0 lb

## 2013-01-07 DIAGNOSIS — B20 Human immunodeficiency virus [HIV] disease: Secondary | ICD-10-CM

## 2013-01-07 MED ORDER — EFAVIRENZ-EMTRICITAB-TENOFOVIR 600-200-300 MG PO TABS: 1.0000 | ORAL_TABLET | Freq: Every day | ORAL | Status: DC

## 2013-01-07 NOTE — Progress Notes (Signed)
Patient ID: Chloe Hancock, female   DOB: Jun 01, 1951, 62 y.o.   MRN: [id number]          [facility] for Infectious Disease  Patient Active Problem List   Diagnosis Date Noted  . Irregular bleeding   . Hyperglycemia 01/03/2011  . ALLERGIC RHINITIS 11/02/2009  . HYPERTENSION NEC 11/02/2009  . HIV DISEASE 07/28/2006  . DIABETES MELLITUS, TYPE II 07/28/2006  . HEADACHE 07/28/2006    Patient's Medications  New Prescriptions   No medications on file  Previous Medications   CALCIUM CARBONATE (OS-CAL) 600 MG TABS    Take 600 mg by mouth daily.   CHOLECALCIFEROL (VITAMIN D) 1000 UNITS TABLET    Take 1,000 Units by mouth daily.   FISH OIL-OMEGA-3 FATTY ACIDS 1000 MG CAPSULE    Take 1 g by mouth daily.     GLUCOSE BLOOD TEST STRIP    Use as instructed up to 2 x daily   MULTIPLE VITAMINS-MINERALS (MULTIVITAMIN & MINERAL PO)    Take by mouth.     QUINAPRIL-HYDROCHLOROTHIAZIDE (ACCURETIC) 20-25 MG PER TABLET    Take 0.5 tablets by mouth daily.    Modified Medications   Modified Medication Previous Medication   EFAVIRENZ-EMTRICITABINE-TENOFOVIR (ATRIPLA) 600-200-300 MG PER TABLET ATRIPLA 600-200-300 MG per tablet      Take 1 tablet by mouth daily.    TAKE ONE TABLET BY MOUTH EVERY DAY  Discontinued Medications   BL LANCETS MISC    [place] to check blood sugar up to 2x daily    Subjective: Chloe Hancock is in for her routine visit. She has not missed any doses of her Atripla. She is doing well and has no new complaints. She is looking forward to a trip to [place] this summer to go to her nephew's wedding.  Review of Systems: Pertinent items are noted in HPI.  Past Medical History  Diagnosis Date  . Irregular bleeding   . Headache(784.0)   . Migraines   . HIV positive 1998    History  Substance Use Topics  . Smoking status: Never Smoker   . Smokeless tobacco: Never Used  . Alcohol Use: No    Family History  Problem Relation Age of Onset  . Diabetes Father     No Known  Allergies  Objective: Temp: 98 F (36.7 C) (06/24 1003) Temp src: Oral (06/24 1003) BP: 125/76 mmHg (06/24 1003) Pulse Rate: 96 (06/24 1003)  General: She is in good spirits Oral: No oropharyngeal lesions Skin: No rash Lungs: Clear Cor: Regular S1 and S2 no murmurs  Lab Results HIV 1 RNA Quant (copies/mL)  Date Value  12/24/2012 <20   06/03/2012 <20   12/28/2011 <20      CD4 T Cell Abs (cmm)  Date Value  12/24/2012 500   06/03/2012 590   12/28/2011 630      Lab Results  Component Value Date   WBC 4.0 12/24/2012   HGB 12.0 12/24/2012   HCT 36.5 12/24/2012   MCV 83.5 12/24/2012   PLT 225 12/24/2012   BMET    Component Value Date/Time   NA 140 12/24/2012 1025   K 4.7 12/24/2012 1025   CL 108 12/24/2012 1025   CO2 25 12/24/2012 1025   GLUCOSE 111* 12/24/2012 1025   BUN 12 12/24/2012 1025   CREATININE 0.58 12/24/2012 1025   CREATININE 0.62 05/18/2010 2050   CALCIUM 9.2 12/24/2012 1025   Lab Results  Component Value Date   ALT 12 12/24/2012   AST 17  12/24/2012   ALKPHOS 80 12/24/2012   BILITOT 0.2* 12/24/2012   Lab Results  Component Value Date   CHOL 205* 12/24/2012   HDL 52 12/24/2012   LDLCALC 132* 12/24/2012   TRIG 105 12/24/2012   CHOLHDL 3.9 12/24/2012   Assessment: Her HIV infection remains under excellent control. She has dyslipidemia and mild hyperglycemia. I will make a nutrition referral and I suggested that she discuss this with her primary care physician, Dr. Leonides Sake.  Plan: 1. Continue Atripla 2. Nutrition referral 3. Followup here after lab work in 6 months   Cliffton Asters, MD [facility] for Infectious Disease [facility] [phone] pager   [phone] cell 01/07/2013, 11:23 AM

## 2013-01-14 NOTE — Addendum Note (Signed)
Addended by: Jennet Maduro D on: 01/14/2013 08:43 AM   Modules accepted: Orders

## 2013-01-20 ENCOUNTER — Ambulatory Visit: Payer: BC Managed Care – PPO

## 2013-01-20 VITALS — Wt 155.5 lb

## 2013-01-20 DIAGNOSIS — R739 Hyperglycemia, unspecified: Secondary | ICD-10-CM

## 2013-01-20 DIAGNOSIS — E7800 Pure hypercholesterolemia, unspecified: Secondary | ICD-10-CM

## 2013-01-20 NOTE — Progress Notes (Signed)
Medical Nutrition Therapy:  Appt start time: 1130 end time:  1230.  Assessment:  Primary concerns today: hyperglycemia and hyperlipidemia.   Please note- pt revealed she had not fasted for bloodwork taken at the [facility], so BG of 111 mg/dL and lipid labs were a few hours after her breakfast. Pt claimed she had a HgbA1c with her PCP and it was normal at her last physical.  MEDICATIONS: see list.   DIETARY INTAKE:  Usual eating pattern includes 3 meals and 0-2 snacks per day.  Everyday foods include fruit, chix, beans.  Avoided foods include none noted.    24-hr recall:  B ( AM): used to have oatmeal and blueberries for breakfast. Now has on new diet smoothie of almond milk, blueberries, spinach, banana, and plant protein powder Snk ( AM): none  L ( PM): spinach, chix or beans, fruit. New diet- chix breast and veg or beans and veg Snk ( PM): fruit. No fruit on new diet D ( PM): chix or beans with "black" rice, or brown rice, vegetables (broccoli, kale, large variety). New diet- chix or beans and veg Snk ( PM): none. When not on diet, rare choice of ice cream or bread Beverages: water only for now with new diet. When not on diet, also carrot juice Claims to enjoy fruits main snack.  Usual physical activity: pt claims she did not go to gym much at all during winter because it was too cold. Now doing aerobics video from home 30 minutes 4 times per week. She very recently picked this back up and wants to return to gym after building endurance. Was walking before this video about 2 miles each day.   Progress Towards Goal(s):  In progress.   Nutritional Diagnosis:  Food and nutrition-related knowledge deficit related to development of diabetes type II, hyperlipidemia as evidenced by pt statements of lack of prior formal education on the subjects.    Intervention:  Nutrition counseling provided regarding best choices to improve blood lipids, CHO counting, and general portion control. Protein needs  were also discussed, particularly the need for protein foods at all 3 meals each day. RD explained and demonstrated CHO counting for patient, and concept of moderation for CHO foods. RD also recommended increasing physical activity to minimum 150 minutes per week by adding walking or another day of aerobics videos to pt's current routine. RD recommended F/U with PCP regarding HgbA1c and Pre-DM or DM risk. CHO controlled diet plan provided.  Handouts given during visit include:  Diabetes basics  CHO controlled diet  Foods to Choose more of and to Limit to improve cholesterol  Monitoring/Evaluation:  Dietary intake, exercise, portion contro, and body weight prn.

## 2013-06-30 ENCOUNTER — Other Ambulatory Visit (INDEPENDENT_AMBULATORY_CARE_PROVIDER_SITE_OTHER): Payer: BC Managed Care – PPO

## 2013-06-30 DIAGNOSIS — B20 Human immunodeficiency virus [HIV] disease: Secondary | ICD-10-CM

## 2013-06-30 LAB — COMPREHENSIVE METABOLIC PANEL
ALT: 17 U/L (ref 0–35)
AST: 15 U/L (ref 0–37)
Albumin: 4.1 g/dL (ref 3.5–5.2)
CO2: 29 mEq/L (ref 19–32)
Calcium: 9.2 mg/dL (ref 8.4–10.5)
Chloride: 107 mEq/L (ref 96–112)
Creat: 0.65 mg/dL (ref 0.50–1.10)
Potassium: 4.9 mEq/L (ref 3.5–5.3)
Sodium: 141 mEq/L (ref 135–145)
Total Protein: 6.6 g/dL (ref 6.0–8.3)

## 2013-06-30 LAB — CBC
MCHC: 33.9 g/dL (ref 30.0–36.0)
MCV: 85.1 fL (ref 78.0–100.0)
Platelets: 240 10*3/uL (ref 150–400)
RDW: 15.4 % (ref 11.5–15.5)
WBC: 4.7 10*3/uL (ref 4.0–10.5)

## 2013-06-30 LAB — LIPID PANEL
HDL: 70 mg/dL (ref >39)
LDL Cholesterol: 132 mg/dL — ABNORMAL HIGH (ref 0–99)

## 2013-07-01 LAB — RPR

## 2013-07-01 LAB — T-HELPER CELL (CD4) - (RCID CLINIC ONLY): CD4 T Cell Abs: 650 /uL (ref 400–2700)

## 2013-07-15 ENCOUNTER — Ambulatory Visit: Payer: BC Managed Care – PPO | Admitting: Internal Medicine

## 2013-07-21 ENCOUNTER — Ambulatory Visit (INDEPENDENT_AMBULATORY_CARE_PROVIDER_SITE_OTHER): Payer: BC Managed Care – PPO | Admitting: Internal Medicine

## 2013-07-21 VITALS — BP 152/76 | HR 80 | Temp 98.1°F | Ht 65.5 in | Wt 160.0 lb

## 2013-07-21 DIAGNOSIS — B20 Human immunodeficiency virus [HIV] disease: Secondary | ICD-10-CM

## 2013-07-21 NOTE — Progress Notes (Signed)
Patient ID: Chloe Hancock, female   DOB: Jun 27, 1951, 63 y.o.   MRN: [id number]          [facility] for Infectious Disease  Patient Active Problem List   Diagnosis Date Noted  . Irregular bleeding   . Hyperglycemia 01/03/2011  . ALLERGIC RHINITIS 11/02/2009  . HYPERTENSION NEC 11/02/2009  . HIV DISEASE 07/28/2006  . DIABETES MELLITUS, TYPE II 07/28/2006  . HEADACHE 07/28/2006    Patient's Medications  New Prescriptions   No medications on file  Previous Medications   CALCIUM CARBONATE (OS-CAL) 600 MG TABS    Take 600 mg by mouth daily.   CHOLECALCIFEROL (VITAMIN D) 1000 UNITS TABLET    Take 1,000 Units by mouth daily.   EFAVIRENZ-EMTRICITABINE-TENOFOVIR (ATRIPLA) 600-200-300 MG PER TABLET    Take 1 tablet by mouth daily.   FISH OIL-OMEGA-3 FATTY ACIDS 1000 MG CAPSULE    Take 1 g by mouth daily.     GLUCOSE BLOOD TEST STRIP    Use as instructed up to 2 x daily   MULTIPLE VITAMINS-MINERALS (MULTIVITAMIN & MINERAL PO)    Take by mouth.     QUINAPRIL-HYDROCHLOROTHIAZIDE (ACCURETIC) 20-25 MG PER TABLET    Take 0.5 tablets by mouth daily.     TOPIRAMATE (TOPAMAX) 25 MG TABLET    Take 25 mg by mouth daily.  Modified Medications   No medications on file  Discontinued Medications   No medications on file    Subjective: Chloe Hancock is in for her routine visit. As usual she never misses a single dose of her Atripla. She met with the nutritionist last July and feels like that has helped her with her meal preparation and intake.  Review of Systems: Pertinent items are noted in HPI.  Past Medical History  Diagnosis Date  . Irregular bleeding   . Headache(784.0)   . Migraines   . HIV positive 1998    History  Substance Use Topics  . Smoking status: Never Smoker   . Smokeless tobacco: Never Used  . Alcohol Use: No    Family History  Problem Relation Age of Onset  . Diabetes Father     No Known Allergies  Objective: Temp: 98.1 F (36.7 C) (01/05 1512) Temp src: Oral  (01/05 1512) BP: 152/76 mmHg (01/05 1512) Pulse Rate: 80 (01/05 1512)  General: She is in good spirits Oral: No oropharyngeal lesions Skin: No rash Lungs: Clear Cor: Regular S1-S2 no murmurs  Lab Results Lab Results  Component Value Date   WBC 4.7 06/30/2013   HGB 12.4 06/30/2013   HCT 36.6 06/30/2013   MCV 85.1 06/30/2013   PLT 240 06/30/2013    Lab Results  Component Value Date   CREATININE 0.65 06/30/2013   BUN 12 06/30/2013   NA 141 06/30/2013   K 4.9 06/30/2013   CL 107 06/30/2013   CO2 29 06/30/2013    Lab Results  Component Value Date   ALT 17 06/30/2013   AST 15 06/30/2013   ALKPHOS 68 06/30/2013   BILITOT 0.3 06/30/2013    Lab Results  Component Value Date   CHOL 218* 06/30/2013   HDL 70 06/30/2013   LDLCALC 132* 06/30/2013   TRIG 81 06/30/2013   CHOLHDL 3.1 06/30/2013    Lab Results HIV 1 RNA Quant (copies/mL)  Date Value  06/30/2013 <20   12/24/2012 <20   06/03/2012 <20      CD4 T Cell Abs (/uL)  Date Value  06/30/2013 650   12/24/2012 500  06/03/2012 590      Lab Results  Component Value Date   WBC 4.7 06/30/2013   HGB 12.4 06/30/2013   HCT 36.6 06/30/2013   MCV 85.1 06/30/2013   PLT 240 06/30/2013   BMET    Component Value Date/Time   NA 141 06/30/2013 1048   K 4.9 06/30/2013 1048   CL 107 06/30/2013 1048   CO2 29 06/30/2013 1048   GLUCOSE 106* 06/30/2013 1048   BUN 12 06/30/2013 1048   CREATININE 0.65 06/30/2013 1048   CREATININE 0.62 05/18/2010 2050   CALCIUM 9.2 06/30/2013 1048   Lab Results  Component Value Date   ALT 17 06/30/2013   AST 15 06/30/2013   ALKPHOS 68 06/30/2013   BILITOT 0.3 06/30/2013   Lab Results  Component Value Date   CHOL 218* 06/30/2013   HDL 70 06/30/2013   LDLCALC 132* 06/30/2013   TRIG 81 06/30/2013   CHOLHDL 3.1 06/30/2013   Assessment: For HIV infection remains under excellent control.  She continues to have mild hyperglycemia and elevations of LDL cholesterol but her most recent  blood work was obtained after her morning meal.  Plan: 1. Continue Atripla 2. Followup after lab work in [place] months   Michel Bickers, MD [facility] [place] [phone] pager   [phone] cell 07/21/2013, 3:35 PM

## 2013-10-08 ENCOUNTER — Other Ambulatory Visit: Payer: Self-pay

## 2013-10-08 DIAGNOSIS — Z1231 Encounter for screening mammogram for malignant neoplasm of breast: Secondary | ICD-10-CM

## 2013-11-07 ENCOUNTER — Encounter (INDEPENDENT_AMBULATORY_CARE_PROVIDER_SITE_OTHER): Payer: Self-pay

## 2013-11-07 ENCOUNTER — Ambulatory Visit: Payer: BC Managed Care – PPO

## 2013-11-07 ENCOUNTER — Ambulatory Visit
Admission: RE | Admit: 2013-11-07 | Discharge: 2013-11-07 | Disposition: A | Discharge status: Home / Self Care | Payer: BC Managed Care – PPO | Source: Ambulatory Visit

## 2013-11-07 DIAGNOSIS — Z1231 Encounter for screening mammogram for malignant neoplasm of breast: Secondary | ICD-10-CM

## 2014-01-08 ENCOUNTER — Other Ambulatory Visit: Payer: Self-pay | Admitting: Internal Medicine

## 2014-01-10 ENCOUNTER — Other Ambulatory Visit: Payer: Self-pay | Admitting: Internal Medicine

## 2014-01-12 ENCOUNTER — Other Ambulatory Visit: Payer: Self-pay | Admitting: *Deleted

## 2014-01-12 DIAGNOSIS — B20 Human immunodeficiency virus [HIV] disease: Secondary | ICD-10-CM

## 2014-01-12 MED ORDER — EFAVIRENZ-EMTRICITAB-TENOFOVIR 600-200-300 MG PO TABS: ORAL_TABLET | ORAL | Status: DC

## 2014-01-21 ENCOUNTER — Other Ambulatory Visit: Payer: BC Managed Care – PPO

## 2014-01-21 DIAGNOSIS — B20 Human immunodeficiency virus [HIV] disease: Secondary | ICD-10-CM

## 2014-01-22 LAB — HIV-1 RNA QUANT-NO REFLEX-BLD
HIV 1 RNA Quant: <20 copies/mL (ref <20)
HIV-1 RNA Quant, Log: <1.3 {Log} (ref <1.30)

## 2014-01-22 LAB — T-HELPER CELL (CD4) - (RCID CLINIC ONLY)
CD4 % Helper T Cell: 27 % — ABNORMAL LOW (ref 33–55)
CD4 T Cell Abs: 460 /uL (ref 400–2700)

## 2014-02-17 ENCOUNTER — Ambulatory Visit (INDEPENDENT_AMBULATORY_CARE_PROVIDER_SITE_OTHER): Payer: BC Managed Care – PPO | Admitting: Internal Medicine

## 2014-02-17 ENCOUNTER — Encounter: Payer: Self-pay | Admitting: Internal Medicine

## 2014-02-17 VITALS — BP 128/81 | HR 83 | Temp 98.0°F | Wt 162.5 lb

## 2014-02-17 DIAGNOSIS — B20 Human immunodeficiency virus [HIV] disease: Secondary | ICD-10-CM | POA: Diagnosis not present

## 2014-02-17 NOTE — Progress Notes (Signed)
Patient ID: Chloe Hancock, female   DOB: 05/03/1951, 63 y.o.   MRN: [id number]          [facility] for Infectious Disease  Patient Active Problem List   Diagnosis Date Noted  . Irregular bleeding   . Hyperglycemia 01/03/2011  . ALLERGIC RHINITIS 11/02/2009  . HYPERTENSION NEC 11/02/2009  . HIV DISEASE 07/28/2006  . DIABETES MELLITUS, TYPE II 07/28/2006  . HEADACHE 07/28/2006    Patient's Medications  New Prescriptions   No medications on file  Previous Medications   CALCIUM CARBONATE (OS-CAL) 600 MG TABS    Take 600 mg by mouth daily.   CHOLECALCIFEROL (VITAMIN D) 1000 UNITS TABLET    Take 1,000 Units by mouth daily.   EFAVIRENZ-EMTRICITABINE-TENOFOVIR (ATRIPLA) 600-200-300 MG PER TABLET    TAKE ONE TABLET BY MOUTH ONCE DAILY   FISH OIL-OMEGA-3 FATTY ACIDS 1000 MG CAPSULE    Take 1 g by mouth daily.     GLUCOSE BLOOD TEST STRIP    Use as instructed up to 2 x daily   MULTIPLE VITAMINS-MINERALS (MULTIVITAMIN & MINERAL PO)    Take by mouth.     QUINAPRIL-HYDROCHLOROTHIAZIDE (ACCURETIC) 20-25 MG PER TABLET    Take 0.5 tablets by mouth daily.     TOPIRAMATE (TOPAMAX) 25 MG TABLET    Take 25 mg by mouth daily.  Modified Medications   No medications on file  Discontinued Medications   No medications on file    Subjective: Chloe Hancock is in for her routine visit. As usual she never misses a single dose of her Atripla. She continues to work on dietary modification and is trying to eat a much healthier diet.  Review of Systems: Pertinent items are noted in HPI.  Past Medical History  Diagnosis Date  . Irregular bleeding   . Headache(784.0)   . Migraines   . HIV positive 1998    History  Substance Use Topics  . Smoking status: Never Smoker   . Smokeless tobacco: Never Used  . Alcohol Use: No    Family History  Problem Relation Age of Onset  . Diabetes Father     No Known Allergies  Objective: Temp: 98 F (36.7 C) (08/04 1013) Temp src: Oral (08/04 1013) BP:  128/81 mmHg (08/04 1013) Pulse Rate: 83 (08/04 1013)  General: She is in good spirits Oral: No oropharyngeal lesions Skin: No rash Lungs: Clear Cor: Regular S1-S2 no murmurs  Lab Results Lab Results  Component Value Date   WBC 4.7 06/30/2013   HGB 12.4 06/30/2013   HCT 36.6 06/30/2013   MCV 85.1 06/30/2013   PLT 240 06/30/2013    Lab Results  Component Value Date   CREATININE 0.65 06/30/2013   BUN 12 06/30/2013   NA 141 06/30/2013   K 4.9 06/30/2013   CL 107 06/30/2013   CO2 29 06/30/2013    Lab Results  Component Value Date   ALT 17 06/30/2013   AST 15 06/30/2013   ALKPHOS 68 06/30/2013   BILITOT 0.3 06/30/2013    Lab Results  Component Value Date   CHOL 218* 06/30/2013   HDL 70 06/30/2013   LDLCALC 132* 06/30/2013   TRIG 81 06/30/2013   CHOLHDL 3.1 06/30/2013    Lab Results HIV 1 RNA Quant (copies/mL)  Date Value  01/21/2014 <20   06/30/2013 <20   12/24/2012 <20      CD4 T Cell Abs (/uL)  Date Value  01/21/2014 460   06/30/2013 650   12/24/2012  500      Lab Results  Component Value Date   WBC 4.7 06/30/2013   HGB 12.4 06/30/2013   HCT 36.6 06/30/2013   MCV 85.1 06/30/2013   PLT 240 06/30/2013   BMET    Component Value Date/Time   NA 141 06/30/2013 1048   K 4.9 06/30/2013 1048   CL 107 06/30/2013 1048   CO2 29 06/30/2013 1048   GLUCOSE 106* 06/30/2013 1048   BUN 12 06/30/2013 1048   CREATININE 0.65 06/30/2013 1048   CREATININE 0.62 05/18/2010 2050   CALCIUM 9.2 06/30/2013 1048   GFRNONAA >60 12/20/2010 1224   GFRAA >60 12/20/2010 1224   Lab Results  Component Value Date   ALT 17 06/30/2013   AST 15 06/30/2013   ALKPHOS 68 06/30/2013   BILITOT 0.3 06/30/2013   Lab Results  Component Value Date   CHOL 218* 06/30/2013   HDL 70 06/30/2013   LDLCALC 132* 06/30/2013   TRIG 81 06/30/2013   CHOLHDL 3.1 06/30/2013   Assessment: For HIV infection remains under excellent control.  Plan: 1. Continue Atripla 2. Followup after lab work in 6  months   Cliffton AstersJohn Ariely Riddell, MD [facility] [phone] pager   [phone] cell 02/17/2014, 10:32 AM

## 2014-03-25 ENCOUNTER — Ambulatory Visit (INDEPENDENT_AMBULATORY_CARE_PROVIDER_SITE_OTHER): Payer: BC Managed Care – PPO | Admitting: *Deleted

## 2014-03-25 DIAGNOSIS — Z23 Encounter for immunization: Secondary | ICD-10-CM

## 2014-05-18 ENCOUNTER — Encounter: Payer: Self-pay | Admitting: Internal Medicine

## 2014-09-02 ENCOUNTER — Other Ambulatory Visit: Payer: Self-pay | Admitting: Internal Medicine

## 2014-09-02 DIAGNOSIS — B20 Human immunodeficiency virus [HIV] disease: Secondary | ICD-10-CM

## 2014-10-22 ENCOUNTER — Other Ambulatory Visit: Payer: Self-pay

## 2014-10-22 DIAGNOSIS — Z1231 Encounter for screening mammogram for malignant neoplasm of breast: Secondary | ICD-10-CM

## 2014-10-31 ENCOUNTER — Other Ambulatory Visit: Payer: Self-pay | Admitting: Internal Medicine

## 2014-11-10 DIAGNOSIS — T2611XA Burn of cornea and conjunctival sac, right eye, initial encounter: Secondary | ICD-10-CM | POA: Diagnosis not present

## 2014-11-10 DIAGNOSIS — T2612XA Burn of cornea and conjunctival sac, left eye, initial encounter: Secondary | ICD-10-CM | POA: Diagnosis not present

## 2014-11-11 DIAGNOSIS — T2612XA Burn of cornea and conjunctival sac, left eye, initial encounter: Secondary | ICD-10-CM | POA: Diagnosis not present

## 2014-11-11 DIAGNOSIS — T2611XA Burn of cornea and conjunctival sac, right eye, initial encounter: Secondary | ICD-10-CM | POA: Diagnosis not present

## 2014-11-13 ENCOUNTER — Ambulatory Visit
Admission: RE | Admit: 2014-11-13 | Discharge: 2014-11-13 | Disposition: A | Discharge status: Home / Self Care | Payer: BC Managed Care – PPO | Source: Ambulatory Visit

## 2014-11-13 DIAGNOSIS — T2612XA Burn of cornea and conjunctival sac, left eye, initial encounter: Secondary | ICD-10-CM | POA: Diagnosis not present

## 2014-11-13 DIAGNOSIS — T2611XA Burn of cornea and conjunctival sac, right eye, initial encounter: Secondary | ICD-10-CM | POA: Diagnosis not present

## 2014-11-13 DIAGNOSIS — Z1231 Encounter for screening mammogram for malignant neoplasm of breast: Secondary | ICD-10-CM

## 2014-12-02 ENCOUNTER — Other Ambulatory Visit: Payer: BC Managed Care – PPO

## 2014-12-02 DIAGNOSIS — B20 Human immunodeficiency virus [HIV] disease: Secondary | ICD-10-CM

## 2014-12-02 LAB — CBC
HEMATOCRIT: 38.3 % (ref 36.0–46.0)
Hemoglobin: 12.5 g/dL (ref 12.0–15.0)
MCH: 27.9 pg (ref 26.0–34.0)
MCHC: 32.6 g/dL (ref 30.0–36.0)
MCV: 85.5 fL (ref 78.0–100.0)
MPV: 10.5 fL (ref 8.6–12.4)
Platelets: 239 10*3/uL (ref 150–400)
RBC: 4.48 MIL/uL (ref 3.87–5.11)
RDW: 15.6 % — ABNORMAL HIGH (ref 11.5–15.5)
WBC: 4.8 10*3/uL (ref 4.0–10.5)

## 2014-12-02 LAB — COMPREHENSIVE METABOLIC PANEL
ALK PHOS: 70 U/L (ref 39–117)
ALT: 12 U/L (ref 0–35)
AST: 16 U/L (ref 0–37)
Albumin: 4.1 g/dL (ref 3.5–5.2)
BUN: 15 mg/dL (ref 6–23)
CALCIUM: 8.8 mg/dL (ref 8.4–10.5)
CO2: 27 mEq/L (ref 19–32)
CREATININE: 0.54 mg/dL (ref 0.50–1.10)
Chloride: 105 mEq/L (ref 96–112)
Glucose, Bld: 112 mg/dL — ABNORMAL HIGH (ref 70–99)
Potassium: 4.4 mEq/L (ref 3.5–5.3)
Sodium: 140 mEq/L (ref 135–145)
Total Bilirubin: 0.2 mg/dL (ref 0.2–1.2)
Total Protein: 6.8 g/dL (ref 6.0–8.3)

## 2014-12-02 LAB — LIPID PANEL
Cholesterol: 213 mg/dL — ABNORMAL HIGH (ref 0–200)
HDL: 52 mg/dL (ref >=46)
LDL Cholesterol: 131 mg/dL — ABNORMAL HIGH (ref 0–99)
TRIGLYCERIDES: 150 mg/dL — AB (ref <150)
Total CHOL/HDL Ratio: 4.1 Ratio
VLDL: 30 mg/dL (ref 0–40)

## 2014-12-03 LAB — T-HELPER CELL (CD4) - (RCID CLINIC ONLY)
CD4 % Helper T Cell: 31 % — ABNORMAL LOW (ref 33–55)
CD4 T CELL ABS: 630 /uL (ref 400–2700)

## 2014-12-03 LAB — RPR

## 2014-12-04 LAB — HIV-1 RNA QUANT-NO REFLEX-BLD: HIV-1 RNA Quant, Log: <1.3 {Log} (ref <1.30)

## 2014-12-22 ENCOUNTER — Ambulatory Visit: Payer: BC Managed Care – PPO | Admitting: Internal Medicine

## 2014-12-23 ENCOUNTER — Ambulatory Visit (INDEPENDENT_AMBULATORY_CARE_PROVIDER_SITE_OTHER): Payer: BC Managed Care – PPO | Admitting: Internal Medicine

## 2014-12-23 DIAGNOSIS — B20 Human immunodeficiency virus [HIV] disease: Secondary | ICD-10-CM | POA: Diagnosis not present

## 2014-12-23 MED ORDER — ELVITEG-COBIC-EMTRICIT-TENOFAF 150-150-200-10 MG PO TABS: 1.0000 | ORAL_TABLET | Freq: Every day | ORAL | Status: DC

## 2014-12-23 NOTE — Progress Notes (Signed)
Patient ID: Chloe Hancock, female   DOB: [birth year], 64 y.o.   MRN: [id number]          Patient Active Problem List   Diagnosis Date Noted  . Irregular bleeding   . Hyperglycemia 01/03/2011  . ALLERGIC RHINITIS 11/02/2009  . HYPERTENSION NEC 11/02/2009  . Human immunodeficiency virus (HIV) disease 07/28/2006  . DIABETES MELLITUS, TYPE II 07/28/2006  . HEADACHE 07/28/2006    Patient's Medications  New Prescriptions   ELVITEGRAVIR-COBICISTAT-EMTRICITABINE-TENOFOVIR (GENVOYA) 150-150-200-10 MG TABS TABLET    Take 1 tablet by mouth daily with breakfast.  Previous Medications   CALCIUM CARBONATE (OS-CAL) 600 MG TABS    Take 600 mg by mouth daily.   CHOLECALCIFEROL (VITAMIN D) 1000 UNITS TABLET    Take 1,000 Units by mouth daily.   FISH OIL-OMEGA-3 FATTY ACIDS 1000 MG CAPSULE    Take 1 g by mouth daily.     GLUCOSE BLOOD TEST STRIP    Use as instructed up to 2 x daily   MULTIPLE VITAMINS-MINERALS (MULTIVITAMIN & MINERAL PO)    Take by mouth.     QUINAPRIL-HYDROCHLOROTHIAZIDE (ACCURETIC) 20-25 MG PER TABLET    Take 0.5 tablets by mouth daily.    Modified Medications   No medications on file  Discontinued Medications   ATRIPLA 600-200-300 MG PER TABLET    TAKE ONE TABLET BY MOUTH ONCE DAILY   TOPIRAMATE (TOPAMAX) 25 MG TABLET    Take 25 mg by mouth daily.    Subjective: Chloe Hancock is in for her routine HIV follow-up visit. She never misses a single dose of her Atripla. She is feeling well. She does her best to eat a healthy diet and get regular exercise. Review of Systems: Pertinent items are noted in HPI.  Past Medical History  Diagnosis Date  . Irregular bleeding   . Headache(784.0)   . Migraines   . HIV positive 1998    History  Substance Use Topics  . Smoking status: Never Smoker   . Smokeless tobacco: Never Used  . Alcohol Use: No    Family History  Problem Relation Age of Onset  . Diabetes Father     No Known Allergies  Objective: Temp: 97.8 F (36.6 C)  (06/08 1615) Temp Source: Oral (06/08 1615) BP: 123/76 mmHg (06/08 1622) Pulse Rate: 84 (06/08 1622) Body mass index is 26.78 kg/(m^2).  General: She is in no distress and in good spirits Oral: No oropharyngeal lesions Skin: No rash Lungs: Clear Cor: Regular S1 and S2 with no murmurs  Lab Results Lab Results  Component Value Date   WBC 4.8 12/02/2014   HGB 12.5 12/02/2014   HCT 38.3 12/02/2014   MCV 85.5 12/02/2014   PLT 239 12/02/2014    Lab Results  Component Value Date   CREATININE 0.54 12/02/2014   BUN 15 12/02/2014   NA 140 12/02/2014   K 4.4 12/02/2014   CL 105 12/02/2014   CO2 27 12/02/2014    Lab Results  Component Value Date   ALT 12 12/02/2014   AST 16 12/02/2014   ALKPHOS 70 12/02/2014   BILITOT 0.2 12/02/2014    Lab Results  Component Value Date   CHOL 213* 12/02/2014   HDL 52 12/02/2014   LDLCALC 131* 12/02/2014   TRIG 150* 12/02/2014   CHOLHDL 4.1 12/02/2014    Lab Results HIV 1 RNA QUANT (copies/mL)  Date Value  12/02/2014 <20  01/21/2014 <20  06/30/2013 <20   CD4 T CELL ABS (/uL)  Date  Value  12/02/2014 630  01/21/2014 460  06/30/2013 650     Assessment: Her HIV infection remains under excellent control. I reviewed new one pill daily regimens with her and she agrees to change to [facility]. This will work just as well as Atripla and we'll decrease her risk of developing renal insufficiency and osteoporosis.  Plan: 1. Change Atripla to Genvoya 1 daily with food 2. Follow-up after lab work in 6 months   Cliffton Asters, MD [facility] [phone] pager   [phone] cell 12/23/2014, 5:23 PM

## 2015-01-12 DIAGNOSIS — I10 Essential (primary) hypertension: Secondary | ICD-10-CM | POA: Diagnosis not present

## 2015-01-12 DIAGNOSIS — E119 Type 2 diabetes mellitus without complications: Secondary | ICD-10-CM | POA: Diagnosis not present

## 2015-01-12 DIAGNOSIS — E780 Pure hypercholesterolemia: Secondary | ICD-10-CM | POA: Diagnosis not present

## 2015-05-19 ENCOUNTER — Encounter: Payer: Self-pay | Admitting: *Deleted

## 2015-05-19 NOTE — Telephone Encounter (Signed)
error 

## 2015-06-03 ENCOUNTER — Other Ambulatory Visit: Payer: Self-pay | Admitting: Family Medicine

## 2015-06-03 ENCOUNTER — Ambulatory Visit
Admission: RE | Admit: 2015-06-03 | Discharge: 2015-06-03 | Disposition: A | Discharge status: Home / Self Care | Payer: BC Managed Care – PPO | Source: Ambulatory Visit | Attending: Family Medicine | Admitting: Family Medicine

## 2015-06-03 DIAGNOSIS — Z139 Encounter for screening, unspecified: Secondary | ICD-10-CM

## 2015-07-05 ENCOUNTER — Other Ambulatory Visit: Payer: Self-pay | Admitting: Licensed Clinical Social Worker

## 2015-07-05 DIAGNOSIS — B20 Human immunodeficiency virus [HIV] disease: Secondary | ICD-10-CM

## 2015-07-05 MED ORDER — ELVITEG-COBIC-EMTRICIT-TENOFAF 150-150-200-10 MG PO TABS: 1.0000 | ORAL_TABLET | Freq: Every day | ORAL | Status: DC

## 2015-11-01 ENCOUNTER — Other Ambulatory Visit: Payer: Self-pay

## 2015-11-01 DIAGNOSIS — Z1231 Encounter for screening mammogram for malignant neoplasm of breast: Secondary | ICD-10-CM

## 2015-11-19 ENCOUNTER — Ambulatory Visit
Admission: RE | Admit: 2015-11-19 | Discharge: 2015-11-19 | Disposition: A | Discharge status: Home / Self Care | Payer: BC Managed Care – PPO | Source: Ambulatory Visit

## 2015-11-19 DIAGNOSIS — Z1231 Encounter for screening mammogram for malignant neoplasm of breast: Secondary | ICD-10-CM

## 2015-12-31 DIAGNOSIS — E7800 Pure hypercholesterolemia, unspecified: Secondary | ICD-10-CM | POA: Diagnosis not present

## 2015-12-31 DIAGNOSIS — B20 Human immunodeficiency virus [HIV] disease: Secondary | ICD-10-CM | POA: Diagnosis not present

## 2015-12-31 DIAGNOSIS — H6981 Other specified disorders of Eustachian tube, right ear: Secondary | ICD-10-CM | POA: Diagnosis not present

## 2015-12-31 DIAGNOSIS — I10 Essential (primary) hypertension: Secondary | ICD-10-CM | POA: Diagnosis not present

## 2015-12-31 DIAGNOSIS — K219 Gastro-esophageal reflux disease without esophagitis: Secondary | ICD-10-CM | POA: Diagnosis not present

## 2015-12-31 DIAGNOSIS — E119 Type 2 diabetes mellitus without complications: Secondary | ICD-10-CM | POA: Diagnosis not present

## 2016-01-11 ENCOUNTER — Other Ambulatory Visit: Payer: BC Managed Care – PPO

## 2016-01-11 DIAGNOSIS — B20 Human immunodeficiency virus [HIV] disease: Secondary | ICD-10-CM

## 2016-01-11 LAB — CBC WITH DIFFERENTIAL/PLATELET
BASOS PCT: 0 %
Basophils Absolute: 0 cells/uL (ref 0–200)
EOS ABS: 45 {cells}/uL (ref 15–500)
Eosinophils Relative: 1 %
HCT: 38.7 % (ref 35.0–45.0)
HEMOGLOBIN: 12.9 g/dL (ref 11.7–15.5)
LYMPHS ABS: 1980 {cells}/uL (ref 850–3900)
LYMPHS PCT: 44 %
MCH: 29.9 pg (ref 27.0–33.0)
MCHC: 33.3 g/dL (ref 32.0–36.0)
MCV: 89.6 fL (ref 80.0–100.0)
MPV: 10.5 fL (ref 7.5–12.5)
Monocytes Absolute: 405 cells/uL (ref 200–950)
Monocytes Relative: 9 %
NEUTROS PCT: 46 %
Neutro Abs: 2070 cells/uL (ref 1500–7800)
Platelets: 170 10*3/uL (ref 140–400)
RBC: 4.32 MIL/uL (ref 3.80–5.10)
RDW: 14.3 % (ref 11.0–15.0)
WBC: 4.5 10*3/uL (ref 3.8–10.8)

## 2016-01-12 LAB — COMPREHENSIVE METABOLIC PANEL
ALBUMIN: 4 g/dL (ref 3.6–5.1)
ALK PHOS: 42 U/L (ref 33–130)
ALT: 15 U/L (ref 6–29)
AST: 17 U/L (ref 10–35)
BUN: 13 mg/dL (ref 7–25)
CALCIUM: 8.8 mg/dL (ref 8.6–10.4)
CO2: 25 mmol/L (ref 20–31)
CREATININE: 0.59 mg/dL (ref 0.50–0.99)
Chloride: 107 mmol/L (ref 98–110)
Glucose, Bld: 88 mg/dL (ref 65–99)
Potassium: 4.4 mmol/L (ref 3.5–5.3)
SODIUM: 141 mmol/L (ref 135–146)
Total Bilirubin: 0.3 mg/dL (ref 0.2–1.2)
Total Protein: 6.7 g/dL (ref 6.1–8.1)

## 2016-01-12 LAB — HIV-1 RNA QUANT-NO REFLEX-BLD
HIV 1 RNA Quant: <20 copies/mL (ref <20)
HIV-1 RNA Quant, Log: <1.3 Log copies/mL (ref <1.30)

## 2016-01-12 LAB — T-HELPER CELL (CD4) - (RCID CLINIC ONLY)
CD4 % Helper T Cell: 23 % — ABNORMAL LOW (ref 33–55)
CD4 T Cell Abs: 610 /uL (ref 400–2700)

## 2016-01-28 ENCOUNTER — Other Ambulatory Visit: Payer: BC Managed Care – PPO

## 2016-02-14 DIAGNOSIS — H10413 Chronic giant papillary conjunctivitis, bilateral: Secondary | ICD-10-CM | POA: Diagnosis not present

## 2016-02-14 DIAGNOSIS — H04123 Dry eye syndrome of bilateral lacrimal glands: Secondary | ICD-10-CM | POA: Diagnosis not present

## 2016-02-15 ENCOUNTER — Ambulatory Visit (INDEPENDENT_AMBULATORY_CARE_PROVIDER_SITE_OTHER): Payer: BC Managed Care – PPO | Admitting: Internal Medicine

## 2016-02-15 DIAGNOSIS — B20 Human immunodeficiency virus [HIV] disease: Secondary | ICD-10-CM | POA: Diagnosis not present

## 2016-02-15 DIAGNOSIS — E785 Hyperlipidemia, unspecified: Secondary | ICD-10-CM | POA: Diagnosis not present

## 2016-02-15 DIAGNOSIS — K219 Gastro-esophageal reflux disease without esophagitis: Secondary | ICD-10-CM

## 2016-02-15 MED ORDER — ELVITEG-COBIC-EMTRICIT-TENOFAF 150-150-200-10 MG PO TABS: 1.0000 | ORAL_TABLET | Freq: Every day | ORAL | 3 refills | Status: DC

## 2016-02-15 NOTE — Assessment & Plan Note (Signed)
Her infection remains under perfect control. She will continue Genvoya in follow-up after lab work in one year. I did ask her to send [place] a MyChart message to let us know the names of her 2 new medications for heartburn and elevated cholesterol.

## 2016-02-15 NOTE — Progress Notes (Signed)
Patient Active Problem List   Diagnosis Date Noted  . Human immunodeficiency virus (HIV) disease (HCC) 07/28/2006    Priority: High  . GERD (gastroesophageal reflux disease) 02/15/2016  . Dyslipidemia 02/15/2016  . Irregular bleeding   . Hyperglycemia 01/03/2011  . ALLERGIC RHINITIS 11/02/2009  . HYPERTENSION NEC 11/02/2009  . DIABETES MELLITUS, TYPE II 07/28/2006  . HEADACHE 07/28/2006    Patient's Medications  New Prescriptions   No medications on file  Previous Medications   CALCIUM CARBONATE (OS-CAL) 600 MG TABS    Take 600 mg by mouth daily.   CHOLECALCIFEROL (VITAMIN D) 1000 UNITS TABLET    Take 1,000 Units by mouth daily.   FISH OIL-OMEGA-3 FATTY ACIDS 1000 MG CAPSULE    Take 1 g by mouth daily.     GLUCOSE BLOOD TEST STRIP    Use as instructed up to 2 x daily   MULTIPLE VITAMINS-MINERALS (MULTIVITAMIN & MINERAL PO)    Take by mouth.     QUINAPRIL-HYDROCHLOROTHIAZIDE (ACCURETIC) 20-25 MG PER TABLET    Take 0.5 tablets by mouth daily.    Modified Medications   Modified Medication Previous Medication   ELVITEGRAVIR-COBICISTAT-EMTRICITABINE-TENOFOVIR (GENVOYA) 150-150-200-10 MG TABS TABLET elvitegravir-cobicistat-emtricitabine-tenofovir (GENVOYA) 150-150-200-10 MG TABS tablet      Take 1 tablet by mouth daily with breakfast.    Take 1 tablet by mouth daily with breakfast.  Discontinued Medications   No medications on file    Subjective: Chloe Hancock is in for her routine HIV follow-up visit. She's had no difficulty obtaining, tolerating or taking her Genvoya. She takes it each morning with breakfast. She can recall missing only 2 doses since her visit 1 year ago. She is feeling well but would like to lose weight. She has recently had some intermittent epigastric discomfort which Dr. Tiburcio Pea told her was indigestion. She is now taking some over-the-counter medication which has helped. She was also started on a new medication for cholesterol and was recently told that her  cholesterol was under good control.   Review of Systems: Review of Systems  Constitutional: Negative for chills, diaphoresis, fever, malaise/fatigue and weight loss.  HENT: Negative for sore throat.   Respiratory: Negative for cough, sputum production and shortness of breath.   Cardiovascular: Negative for chest pain.  Gastrointestinal: Positive for abdominal pain and heartburn. Negative for diarrhea, nausea and vomiting.  Genitourinary: Negative for dysuria.  Musculoskeletal: Negative for joint pain and myalgias.  Skin: Negative for rash.  Neurological: Negative for dizziness and headaches.  Psychiatric/Behavioral: Negative for depression and substance abuse. The patient is not nervous/anxious.     Past Medical History:  Diagnosis Date  . Headache(784.0)   . HIV positive 1998  . Irregular bleeding   . Migraines     Social History  Substance Use Topics  . Smoking status: Never Smoker  . Smokeless tobacco: Never Used  . Alcohol use No    Family History  Problem Relation Age of Onset  . Diabetes Father     No Known Allergies  Objective:  Vitals:   02/15/16 1052  BP: (!) 145/84  Pulse: 74  Temp: 98.6 F (37 C)  TempSrc: Oral  Weight: 168 lb (76.2 kg)   Body mass index is 27.53 kg/m.  Physical Exam  Constitutional: She is oriented to person, place, and time.  She is in very good spirits as usual.  HENT:  Mouth/Throat: No oropharyngeal exudate.  Eyes: Conjunctivae are normal.  Cardiovascular: Normal rate and  regular rhythm.   No murmur heard. Pulmonary/Chest: Breath sounds normal.  Abdominal: Soft. She exhibits no mass. There is no tenderness.  Musculoskeletal: Normal range of motion.  Neurological: She is alert and oriented to person, place, and time.  Skin: No rash noted.  Psychiatric: Mood and affect normal.    Lab Results Lab Results  Component Value Date   WBC 4.5 01/11/2016   HGB 12.9 01/11/2016   HCT 38.7 01/11/2016   MCV 89.6 01/11/2016    PLT 170 01/11/2016    Lab Results  Component Value Date   CREATININE 0.59 01/11/2016   BUN 13 01/11/2016   NA 141 01/11/2016   K 4.4 01/11/2016   CL 107 01/11/2016   CO2 25 01/11/2016    Lab Results  Component Value Date   ALT 15 01/11/2016   AST 17 01/11/2016   ALKPHOS 42 01/11/2016   BILITOT 0.3 01/11/2016    Lab Results  Component Value Date   CHOL 213 (H) 12/02/2014   HDL 52 12/02/2014   LDLCALC 131 (H) 12/02/2014   TRIG 150 (H) 12/02/2014   CHOLHDL 4.1 12/02/2014   HIV 1 RNA Quant (copies/mL)  Date Value  01/11/2016 <20  12/02/2014 <20  01/21/2014 <20   CD4 T Cell Abs (/uL)  Date Value  01/11/2016 610  12/02/2014 630  01/21/2014 460     Problem List Items Addressed This Visit      High   Human immunodeficiency virus (HIV) disease (HCC)    Her infection remains under perfect control. She will continue Genvoya in follow-up after lab work in one year. I did ask her to send [place] a MyChart message to let us know the names of her 2 new medications for heartburn and elevated cholesterol.      Relevant Medications   elvitegravir-cobicistat-emtricitabine-tenofovir (GENVOYA) 150-150-200-10 MG TABS tablet     Unprioritized   Dyslipidemia   GERD (gastroesophageal reflux disease)    Other Visit Diagnoses   None.       Cliffton Asters, MD [facility] for Infectious Disease [facility] [phone] pager   [phone] cell 02/15/2016, 11:14 AM

## 2016-04-17 DIAGNOSIS — Z23 Encounter for immunization: Secondary | ICD-10-CM | POA: Diagnosis not present

## 2016-06-13 DIAGNOSIS — Z01419 Encounter for gynecological examination (general) (routine) without abnormal findings: Secondary | ICD-10-CM | POA: Diagnosis not present

## 2016-06-13 DIAGNOSIS — M8580 Other specified disorders of bone density and structure, unspecified site: Secondary | ICD-10-CM | POA: Diagnosis not present

## 2016-06-13 DIAGNOSIS — Z6827 Body mass index (BMI) 27.0-27.9, adult: Secondary | ICD-10-CM | POA: Diagnosis not present

## 2016-06-13 DIAGNOSIS — Z124 Encounter for screening for malignant neoplasm of cervix: Secondary | ICD-10-CM | POA: Diagnosis not present

## 2016-06-13 DIAGNOSIS — B20 Human immunodeficiency virus [HIV] disease: Secondary | ICD-10-CM | POA: Diagnosis not present

## 2016-06-27 ENCOUNTER — Encounter: Payer: Self-pay | Admitting: Internal Medicine

## 2016-06-28 ENCOUNTER — Encounter: Payer: Self-pay | Admitting: Internal Medicine

## 2016-06-28 DIAGNOSIS — Z227 Latent tuberculosis: Secondary | ICD-10-CM | POA: Insufficient documentation

## 2016-08-03 ENCOUNTER — Ambulatory Visit: Payer: BC Managed Care – PPO | Admitting: Internal Medicine

## 2016-08-09 ENCOUNTER — Ambulatory Visit: Payer: BC Managed Care – PPO | Admitting: Internal Medicine

## 2016-08-24 ENCOUNTER — Encounter: Payer: Self-pay | Admitting: Internal Medicine

## 2016-08-24 ENCOUNTER — Ambulatory Visit (INDEPENDENT_AMBULATORY_CARE_PROVIDER_SITE_OTHER): Payer: BC Managed Care – PPO | Admitting: Internal Medicine

## 2016-08-24 DIAGNOSIS — R7611 Nonspecific reaction to tuberculin skin test without active tuberculosis: Secondary | ICD-10-CM | POA: Diagnosis not present

## 2016-08-24 DIAGNOSIS — Z227 Latent tuberculosis: Secondary | ICD-10-CM

## 2016-08-24 NOTE — Progress Notes (Signed)
HPI: Chloe Hancock is a 66 y.o. female who presents to the [place] clinic today for follow-up of her HIV infection.  She was recently tested for TB and was found to have a positive quantiferon gold TB test. CXR was negative. No s/sx of active TB.  Allergies: No Known Allergies  Past Medical History: Past Medical History:  Diagnosis Date  . Headache(784.0)   . HIV positive ([place]) 1998  . Irregular bleeding   . Migraines     Social History: Social History   Social History  . Marital status: Divorced    Spouse name: N/A  . Number of children: N/A  . Years of education: N/A   Social History Main Topics  . Smoking status: Never Smoker  . Smokeless tobacco: Never Used  . Alcohol use None  . Drug use: No  . Sexual activity: Not Currently     Comment: declined condoms   Other Topics Concern  . None   Social History Narrative  . None    Current Regimen: Genvoya  Labs: HIV 1 RNA Quant (copies/mL)  Date Value  01/11/2016 <20  12/02/2014 <20  01/21/2014 <20   CD4 T Cell Abs (/uL)  Date Value  01/11/2016 610  12/02/2014 630  01/21/2014 460   Hep B S Ab (no units)  Date Value  09/10/2006 No   Hepatitis B Surface Ag (no units)  Date Value  09/10/2006 No   HCV Ab (no units)  Date Value  09/10/2006 No    CrCl: CrCl cannot be calculated (Patient's most recent lab result is older than the maximum 21 days allowed.).  Lipids:    Component Value Date/Time   CHOL 213 (H) 12/02/2014 1428   TRIG 150 (H) 12/02/2014 1428   HDL 52 12/02/2014 1428   CHOLHDL 4.1 12/02/2014 1428   VLDL 30 12/02/2014 1428   LDLCALC 131 (H) 12/02/2014 1428    Assessment: Chloe Hancock is currently on Genvoya with no issues.  She is interested in the 12 week course of weekly INH + Rifapentine but the rifapentine interacts with her Genvoya.  Rifapentine interacts with a majority of our HIV medications.  The best solution is to change her over to Triumeq + another dose of tivicay at night so she  is taking it BID to overcome the interaction with rifapentine.  She will get an HLA test today and come back to see me in 2 weeks to discuss the switch.   Plans: - Continue Genvoya for now - Check HLA test - Come back and see me 2/23 at [place]. Westley Gambles, PharmD Infectious Diseases [facility] 08/24/2016, 9:31 AM

## 2016-08-24 NOTE — Assessment & Plan Note (Signed)
She has latent tuberculosis. There is a new short course regimen to treat latent tuberculosis which consist of taking once weekly INH and rifapentine for 12 weeks. However rifapentine interacts with components of Genvoya. I will check an HLA [id number] to see if she can take Triumeq and Tivicay for the 12 weeks that she is on therapy for latent tuberculosis. She will follow up with our ID pharmacist in 2 weeks.

## 2016-08-24 NOTE — Progress Notes (Signed)
Patient Active Problem List   Diagnosis Date Noted  . Human immunodeficiency virus (HIV) disease ([place]) 07/28/2006    Priority: High  . Latent tuberculosis by blood test 06/28/2016  . GERD (gastroesophageal reflux disease) 02/15/2016  . Dyslipidemia 02/15/2016  . Irregular bleeding   . Hyperglycemia 01/03/2011  . ALLERGIC RHINITIS 11/02/2009  . HYPERTENSION NEC 11/02/2009  . DIABETES MELLITUS, TYPE II 07/28/2006  . HEADACHE 07/28/2006    Patient's Medications  New Prescriptions   No medications on file  Previous Medications   CALCIUM CARBONATE (OS-CAL) 600 MG TABS    Take 600 mg by mouth daily.   CHOLECALCIFEROL (VITAMIN D) 1000 UNITS TABLET    Take 1,000 Units by mouth daily.   ELVITEGRAVIR-COBICISTAT-EMTRICITABINE-TENOFOVIR (GENVOYA) 150-150-200-10 MG TABS TABLET    Take 1 tablet by mouth daily with breakfast.   FISH OIL-OMEGA-3 FATTY ACIDS 1000 MG CAPSULE    Take 1 g by mouth daily.     GLUCOSE BLOOD TEST STRIP    Use as instructed up to 2 x daily   MULTIPLE VITAMINS-MINERALS (MULTIVITAMIN & MINERAL PO)    Take by mouth.     QUINAPRIL-HYDROCHLOROTHIAZIDE (ACCURETIC) 20-25 MG PER TABLET    Take 0.5 tablets by mouth daily.    Modified Medications   No medications on file  Discontinued Medications   No medications on file    Subjective: Teresha is in for a work in visit. She does a lot of work with refugees and was recently exposed to someone who was coughing up blood. She had a QuantiFERON gold TB test which was positive. Her chest x-ray was negative. She has no current fever, cough, shortness of breath or change in appetite.   Review of Systems: Review of Systems  Constitutional: Negative for chills, diaphoresis, fever, malaise/fatigue and weight loss.  HENT: Negative for sore throat.   Respiratory: Negative for cough, sputum production and shortness of breath.   Cardiovascular: Negative for chest pain.  Gastrointestinal: Negative for diarrhea, nausea and  vomiting.  Genitourinary: Negative for dysuria and frequency.  Musculoskeletal: Negative for joint pain and myalgias.  Skin: Negative for rash.  Neurological: Negative for dizziness and headaches.  Psychiatric/Behavioral: Negative for depression and substance abuse. The patient is not nervous/anxious.     Past Medical History:  Diagnosis Date  . Headache(784.0)   . HIV positive ([place]) 1998  . Irregular bleeding   . Migraines     Social History  Substance Use Topics  . Smoking status: Never Smoker  . Smokeless tobacco: Never Used  . Alcohol use Not on file    Family History  Problem Relation Age of Onset  . Diabetes Father     No Known Allergies  Objective:  Vitals:   08/24/16 0857  BP: 131/80  Pulse: 93  Temp: 97.9 F (36.6 C)  TempSrc: Oral  Weight: 168 lb (76.2 kg)  Height: _0  (1.651 m)   Body mass index is 27.96 kg/m.  Physical Exam  Constitutional: She is oriented to person, place, and time.  HENT:  Mouth/Throat: No oropharyngeal exudate.  Eyes: Conjunctivae are normal.  Cardiovascular: Normal rate and regular rhythm.   No murmur heard. Pulmonary/Chest: Breath sounds normal.  Abdominal: Soft. She exhibits no mass. There is no tenderness.  Musculoskeletal: Normal range of motion.  Neurological: She is alert and oriented to person, place, and time.  Skin: No rash noted.  Psychiatric: Mood and affect normal.    Lab Results  Lab Results  Component Value Date   WBC 4.5 01/11/2016   HGB 12.9 01/11/2016   HCT 38.7 01/11/2016   MCV 89.6 01/11/2016   PLT 170 01/11/2016    Lab Results  Component Value Date   CREATININE 0.59 01/11/2016   BUN 13 01/11/2016   NA 141 01/11/2016   K 4.4 01/11/2016   CL 107 01/11/2016   CO2 25 01/11/2016    Lab Results  Component Value Date   ALT 15 01/11/2016   AST 17 01/11/2016   ALKPHOS 42 01/11/2016   BILITOT 0.3 01/11/2016    Lab Results  Component Value Date   CHOL 213 (H) 12/02/2014   HDL 52  12/02/2014   LDLCALC 131 (H) 12/02/2014   TRIG 150 (H) 12/02/2014   CHOLHDL 4.1 12/02/2014   HIV 1 RNA Quant (copies/mL)  Date Value  01/11/2016 <20  12/02/2014 <20  01/21/2014 <20   CD4 T Cell Abs (/uL)  Date Value  01/11/2016 610  12/02/2014 630  01/21/2014 460     Problem List Items Addressed This Visit      Unprioritized   Latent tuberculosis by blood test    She has latent tuberculosis. There is a new short course regimen to treat latent tuberculosis which consist of taking once weekly INH and rifapentine for 12 weeks. However rifapentine interacts with components of Genvoya. I will check an HLA [id number] to see if she can take Triumeq and Tivicay for the 12 weeks that she is on therapy for latent tuberculosis. She will follow up with our ID pharmacist in 2 weeks.      Relevant Orders   HLA B*[id number]        Michel Bickers, MD [facility] [phone] pager   [phone] cell 08/24/2016, 9:24 AM

## 2016-08-31 LAB — HLA B*5701: HLA-B*5701 w/rflx HLA-B High: NEGATIVE

## 2016-09-08 ENCOUNTER — Ambulatory Visit (INDEPENDENT_AMBULATORY_CARE_PROVIDER_SITE_OTHER): Payer: BC Managed Care – PPO | Admitting: Pharmacist

## 2016-09-08 DIAGNOSIS — B20 Human immunodeficiency virus [HIV] disease: Secondary | ICD-10-CM | POA: Diagnosis not present

## 2016-09-08 DIAGNOSIS — R7611 Nonspecific reaction to tuberculin skin test without active tuberculosis: Secondary | ICD-10-CM | POA: Diagnosis not present

## 2016-09-08 DIAGNOSIS — Z227 Latent tuberculosis: Secondary | ICD-10-CM

## 2016-09-08 MED ORDER — DOLUTEGRAVIR SODIUM 50 MG PO TABS: 50.0000 mg | ORAL_TABLET | Freq: Every day | ORAL | 5 refills | Status: DC

## 2016-09-08 MED ORDER — RIFAPENTINE 150 MG PO TABS: 900.0000 mg | ORAL_TABLET | ORAL | 2 refills | Status: DC

## 2016-09-08 MED ORDER — VITAMIN B-6 50 MG PO TABS: 50.0000 mg | ORAL_TABLET | Freq: Every day | ORAL | 2 refills | Status: DC

## 2016-09-08 MED ORDER — ABACAVIR-DOLUTEGRAVIR-LAMIVUD 600-50-300 MG PO TABS: 1.0000 | ORAL_TABLET | Freq: Every day | ORAL | 5 refills | Status: DC

## 2016-09-08 MED ORDER — ISONIAZID 300 MG PO TABS: 900.0000 mg | ORAL_TABLET | ORAL | 2 refills | Status: DC

## 2016-09-08 MED ORDER — ISONIAZID 300 MG PO TABS: 300.0000 mg | ORAL_TABLET | Freq: Every day | ORAL | 8 refills | Status: DC

## 2016-09-08 NOTE — Patient Instructions (Addendum)
[facility]  Start taking these medications:  Isoniazid 300 mg (1 tablet total) once daily Vitamin B6 50 mg (1 tablet total) once daily  Please call [phone] if you have any questions  We will see you for a follow up visit on Friday, April 6th at [facility]11am

## 2016-09-08 NOTE — Progress Notes (Signed)
HPI: Chloe Hancock is a 66 y.o. female who presents to the [place] clinic today to change her ART regimen and initiate therapy for LTBI.   Allergies: No Known Allergies  Past Medical History: Past Medical History:  Diagnosis Date  . Headache(784.0)   . HIV positive ([place]) 1998  . Irregular bleeding   . Migraines     Social History: Social History   Social History  . Marital status: Divorced    Spouse name: N/A  . Number of children: N/A  . Years of education: N/A   Social History Main Topics  . Smoking status: Never Smoker  . Smokeless tobacco: Never Used  . Alcohol use Not on file  . Drug use: No  . Sexual activity: Not Currently     Comment: declined condoms   Other Topics Concern  . Not on file   Social History Narrative  . No narrative on file    Current Regimen: Genvoya  Labs: HIV 1 RNA Quant (copies/mL)  Date Value  01/11/2016 <20  12/02/2014 <20  01/21/2014 <20   CD4 T Cell Abs (/uL)  Date Value  01/11/2016 610  12/02/2014 630  01/21/2014 460   Hep B S Ab (no units)  Date Value  09/10/2006 No   Hepatitis B Surface Ag (no units)  Date Value  09/10/2006 No   HCV Ab (no units)  Date Value  09/10/2006 No    CrCl: CrCl cannot be calculated (Patient's most recent lab result is older than the maximum 21 days allowed.).  Lipids:    Component Value Date/Time   CHOL 213 (H) 12/02/2014 1428   TRIG 150 (H) 12/02/2014 1428   HDL 52 12/02/2014 1428   CHOLHDL 4.1 12/02/2014 1428   VLDL 30 12/02/2014 1428   LDLCALC 131 (H) 12/02/2014 1428    Assessment: Chloe Hancock is here today after having her HLA-B*5701 checked to see if she could switch to Triumeq.  Her HLA was negative.  I went over the different options with her again for her therapy. She was interested in the [place] + Rifapentine once weekly x 12 week regimen for LTBI.  We initially decided to stop her Genvoya and start her on Triumeq once daily in the AM and Tivicay once daily at bedtime  -- we were doing this because the Tivicay component needed to be BID when taking rifapentine.  I [facility] in her [facility] and then sent in her INH + Rifapentine + Vit B6 to [facility] on [address].   While finishing up her visit, the pharmacist from [facility] called and told Chloe Hancock that her insurance did not cover the rifapentine and that it would be $90/month.  We then discussed different options and after a lengthy discussion, Chloe Hancock decided to switch to the INH + Vit B6 daily regimen x 9 months. Since she decided to do this regimen, we also do not have to change her HIV medication.  [facility] and Tivicay Rx. Told them to continue sending the [place]. She will pick up the [place] + Vit B6 on Monday and start then.  I will bring her back in 1 month to do adherence counseling, side effect monitoring, and check a CBC and CMET.   Plans: - Continue Genvoya once daily - Start isoniazid 300 mg PO once daily x 9 months - Start Vit B6 50 mg PO once daily x 9 months - F/u with me 4/6 at 11am  Cassie L. Kuppelweiser, PharmD, [facility] Infectious Disease 09/08/2016, 11:48 AM

## 2016-10-20 ENCOUNTER — Ambulatory Visit (INDEPENDENT_AMBULATORY_CARE_PROVIDER_SITE_OTHER): Payer: BC Managed Care – PPO | Admitting: Pharmacist Clinician (PhC)/ Clinical Pharmacy Specialist

## 2016-10-20 ENCOUNTER — Telehealth: Payer: Self-pay | Admitting: Pharmacist Clinician (PhC)/ Clinical Pharmacy Specialist

## 2016-10-20 DIAGNOSIS — A159 Respiratory tuberculosis unspecified: Secondary | ICD-10-CM

## 2016-10-20 LAB — COMPLETE METABOLIC PANEL WITH GFR
ALT: 18 U/L (ref 6–29)
AST: 22 U/L (ref 10–35)
Albumin: 4.2 g/dL (ref 3.6–5.1)
Alkaline Phosphatase: 44 U/L (ref 33–130)
BUN: 14 mg/dL (ref 7–25)
CALCIUM: 9.1 mg/dL (ref 8.6–10.4)
CHLORIDE: 105 mmol/L (ref 98–110)
CO2: 28 mmol/L (ref 20–31)
Creat: 0.71 mg/dL (ref 0.50–0.99)
GFR, EST NON AFRICAN AMERICAN: 88 mL/min (ref >=60)
GFR, Est African American: >89 mL/min (ref >=60)
Glucose, Bld: 117 mg/dL — ABNORMAL HIGH (ref 65–99)
Potassium: 3.9 mmol/L (ref 3.5–5.3)
Sodium: 141 mmol/L (ref 135–146)
Total Bilirubin: 0.4 mg/dL (ref 0.2–1.2)
Total Protein: 6.9 g/dL (ref 6.1–8.1)

## 2016-10-20 MED ORDER — BICTEGRAVIR-EMTRICITAB-TENOFOV 50-200-25 MG PO TABS: 1.0000 | ORAL_TABLET | Freq: Every day | ORAL | 6 refills | Status: DC

## 2016-10-20 NOTE — Telephone Encounter (Signed)
[facility] and will setup delivery

## 2016-10-20 NOTE — Patient Instructions (Signed)
Continue your Genvoya Continue your INH to complete 9 months Continue your B6

## 2016-10-20 NOTE — Progress Notes (Signed)
HPI: Chloe Hancock is a 66 y.o. female is here to see pharmacy for her INH therapy.   Allergies: No Known Allergies  Vitals:    Past Medical History: Past Medical History:  Diagnosis Date  . Headache(784.0)   . HIV positive (H[facility]) 1998  . Irregular bleeding   . Migraines     Social History: Social History   Social History  . Marital status: Divorced    Spouse name: N/A  . Number of children: N/A  . Years of education: N/A   Social History Main Topics  . Smoking status: Never Smoker  . Smokeless tobacco: Never Used  . Alcohol use Not on file  . Drug use: No  . Sexual activity: Not Currently     Comment: declined condoms   Other Topics Concern  . Not on file   Social History Narrative  . No narrative on file    Previous Regimen: None  Current Regimen: INH  Labs: HIV 1 RNA Quant (copies/mL)  Date Value  01/11/2016 <20  12/02/2014 <20  01/21/2014 <20   CD4 T Cell Abs (/uL)  Date Value  01/11/2016 610  12/02/2014 630  01/21/2014 460   Hep B S Ab (no units)  Date Value  09/10/2006 No   Hepatitis B Surface Ag (no units)  Date Value  09/10/2006 No   HCV Ab (no units)  Date Value  09/10/2006 No    CrCl: CrCl cannot be calculated (Patient's most recent lab result is older than the maximum 21 days allowed.).  Lipids:    Component Value Date/Time   CHOL 213 (H) 12/02/2014 1428   TRIG 150 (H) 12/02/2014 1428   HDL 52 12/02/2014 1428   CHOLHDL 4.1 12/02/2014 1428   VLDL 30 12/02/2014 1428   LDLCALC 131 (H) 12/02/2014 1428    Assessment: Chloe Hancock is doing very well on her INH. She started a few days after 2/23 after finding out about the cost of the rifapentine. Therefore, we decided to do 9 months of INH instead. She has had no side effects and has not missed any doses. She is also very compliant with her Genvoya. She had some questions about TB and risks. Explained to her why we are treating her latent TB right to decrease the change for active  infection. She is on daily B6. She asked if it was ok for her to take the B-complex vitamins too. Told that it should be fine but continue her regular B6 because he complex only has a minute amount of B6 in there.   She also asked about if there will ever be cure for HIV. Told her of where we stand right now in term of research and how difficult it has been finding a cure. She also ask about any new therapy. Told her about Biktarvy and she would much rather try that drug. She has BCBS [place]. I'm going to send it to [facility] pharmacy in [place]. They can do the PA and copay if she is approved. Told her that if they deny, we'll continue her on Genvoya.   We will get her CMP to day to evaluate her LFTs. She will follow up with pharmacy every 2 months for LFTs monitoring until her INH is complete.  Recommendations:  Cont INH x 77mo Cont B6 x 77mo Cmet today F/u in 2 months Change Genvoya to [place] 1 PO qday  Ulyses Southward, PharmD, BCPS, AAHIVP, CPP Clinical Infectious Disease Pharmacist [facility] 10/20/2016, 11:53  AM

## 2016-10-25 ENCOUNTER — Encounter: Payer: Self-pay | Admitting: Internal Medicine

## 2016-11-07 ENCOUNTER — Other Ambulatory Visit: Payer: Self-pay | Admitting: Obstetrics and Gynecology

## 2016-11-07 DIAGNOSIS — Z1231 Encounter for screening mammogram for malignant neoplasm of breast: Secondary | ICD-10-CM

## 2016-11-21 ENCOUNTER — Telehealth: Payer: Self-pay | Admitting: Pharmacist Clinician (PhC)/ Clinical Pharmacy Specialist

## 2016-11-21 NOTE — Telephone Encounter (Signed)
[name] called to say that [facility] said that she has some $600 copay issue. Called [facility] and they said that Biktarvy was shipped out on 5/3 with $0 Copay. I gave her the number to [facility] to see if there is an issue.

## 2016-11-24 ENCOUNTER — Ambulatory Visit
Admission: RE | Admit: 2016-11-24 | Discharge: 2016-11-24 | Disposition: A | Discharge status: Home / Self Care | Payer: BC Managed Care – PPO | Source: Ambulatory Visit | Attending: Obstetrics and Gynecology | Admitting: Obstetrics and Gynecology

## 2016-11-24 DIAGNOSIS — Z1231 Encounter for screening mammogram for malignant neoplasm of breast: Secondary | ICD-10-CM

## 2016-12-04 DIAGNOSIS — H524 Presbyopia: Secondary | ICD-10-CM | POA: Diagnosis not present

## 2016-12-04 DIAGNOSIS — H10413 Chronic giant papillary conjunctivitis, bilateral: Secondary | ICD-10-CM | POA: Diagnosis not present

## 2016-12-04 DIAGNOSIS — H2513 Age-related nuclear cataract, bilateral: Secondary | ICD-10-CM | POA: Diagnosis not present

## 2016-12-04 DIAGNOSIS — H04123 Dry eye syndrome of bilateral lacrimal glands: Secondary | ICD-10-CM | POA: Diagnosis not present

## 2016-12-04 DIAGNOSIS — H35033 Hypertensive retinopathy, bilateral: Secondary | ICD-10-CM | POA: Diagnosis not present

## 2016-12-07 ENCOUNTER — Other Ambulatory Visit: Payer: Self-pay | Admitting: Internal Medicine

## 2016-12-07 DIAGNOSIS — Z227 Latent tuberculosis: Secondary | ICD-10-CM

## 2016-12-22 ENCOUNTER — Ambulatory Visit (INDEPENDENT_AMBULATORY_CARE_PROVIDER_SITE_OTHER): Payer: BC Managed Care – PPO | Admitting: Pharmacist Clinician (PhC)/ Clinical Pharmacy Specialist

## 2016-12-22 DIAGNOSIS — B20 Human immunodeficiency virus [HIV] disease: Secondary | ICD-10-CM

## 2016-12-22 LAB — COMPLETE METABOLIC PANEL WITH GFR
ALBUMIN: 4 g/dL (ref 3.6–5.1)
ALK PHOS: 54 U/L (ref 33–130)
ALT: 22 U/L (ref 6–29)
AST: 21 U/L (ref 10–35)
BILIRUBIN TOTAL: 0.4 mg/dL (ref 0.2–1.2)
BUN: 12 mg/dL (ref 7–25)
CALCIUM: 9 mg/dL (ref 8.6–10.4)
CO2: 24 mmol/L (ref 20–31)
Chloride: 106 mmol/L (ref 98–110)
Creat: 0.67 mg/dL (ref 0.50–0.99)
GFR, Est African American: >89 mL/min (ref >=60)
GFR, Est Non African American: >89 mL/min (ref >=60)
GLUCOSE: 106 mg/dL — AB (ref 65–99)
Potassium: 4 mmol/L (ref 3.5–5.3)
SODIUM: 141 mmol/L (ref 135–146)
TOTAL PROTEIN: 6.5 g/dL (ref 6.1–8.1)

## 2016-12-22 LAB — LIPID PANEL
CHOLESTEROL: 117 mg/dL (ref <200)
HDL: 50 mg/dL — ABNORMAL LOW (ref >50)
LDL Cholesterol: 51 mg/dL (ref <100)
Total CHOL/HDL Ratio: 2.3 Ratio (ref <5.0)
Triglycerides: 79 mg/dL (ref <150)
VLDL: 16 mg/dL (ref <30)

## 2016-12-22 LAB — CBC
HEMATOCRIT: 38.6 % (ref 35.0–45.0)
HEMOGLOBIN: 12.7 g/dL (ref 11.7–15.5)
MCH: 29.8 pg (ref 27.0–33.0)
MCHC: 32.9 g/dL (ref 32.0–36.0)
MCV: 90.6 fL (ref 80.0–100.0)
MPV: 10.9 fL (ref 7.5–12.5)
Platelets: 194 10*3/uL (ref 140–400)
RBC: 4.26 MIL/uL (ref 3.80–5.10)
RDW: 14.2 % (ref 11.0–15.0)
WBC: 5 10*3/uL (ref 3.8–10.8)

## 2016-12-22 LAB — T-HELPER CELL (CD4) - (RCID CLINIC ONLY)
CD4 % Helper T Cell: 28 % — ABNORMAL LOW (ref 33–55)
CD4 T Cell Abs: 540 /uL (ref 400–2700)

## 2016-12-22 NOTE — Addendum Note (Signed)
Addended by: Mariea ClontsGREEN, Jannat Rosemeyer on: 12/22/2016 12:08 PM   Modules accepted: Orders

## 2016-12-22 NOTE — Progress Notes (Signed)
HPI: Chloe Hancock is a 66 y.o. female who is here for her LTBI and HIV monitoring.   Allergies: No Known Allergies  Vitals:    Past Medical History: Past Medical History:  Diagnosis Date  . Headache(784.0)   . HIV positive (HCC) 1998  . Irregular bleeding   . Migraines     Social History: Social History   Social History  . Marital status: Divorced    Spouse name: N/A  . Number of children: N/A  . Years of education: N/A   Social History Main Topics  . Smoking status: Never Smoker  . Smokeless tobacco: Never Used  . Alcohol use Not on file  . Drug use: No  . Sexual activity: Not Currently     Comment: declined condoms   Other Topics Concern  . Not on file   Social History Narrative  . No narrative on file    Previous Regimen: ATP, Genvoya  Current Regimen: Biktarvy  Labs: HIV 1 RNA Quant (copies/mL)  Date Value  01/11/2016 <20  12/02/2014 <20  01/21/2014 <20   CD4 T Cell Abs (/uL)  Date Value  01/11/2016 610  12/02/2014 630  01/21/2014 460   Hep B S Ab (no units)  Date Value  09/10/2006 No   Hepatitis B Surface Ag (no units)  Date Value  09/10/2006 No   HCV Ab (no units)  Date Value  09/10/2006 No    CrCl: CrCl cannot be calculated (Patient's most recent lab result is older than the maximum 21 days allowed.).  Lipids:    Component Value Date/Time   CHOL 213 (H) 12/02/2014 1428   TRIG 150 (H) 12/02/2014 1428   HDL 52 12/02/2014 1428   CHOLHDL 4.1 12/02/2014 1428   VLDL 30 12/02/2014 1428   LDLCALC 131 (H) 12/02/2014 1428    Assessment: Chloe Hancock started her INH around 2/28 for 9 months. She has had no issue so far with missing doses or side effects. She has been following with us every 2 months for her LFTs monitoring. She is also on B6 for neuropathy prevention. She has no pulmonary symptoms.   She is doing super well on her new ART regimen (Biktarvy). She is very happy with the switch because of the lack of side effects, tablet  size, food requirement. We are going to repeat her HIV labs today also. She will follow up with me in 2 months.   Recommendations:  HIV VL/CD4/lipid Cont INH x 9 months Cont B6 x 9 months F/u in 2 months  Chloe SouthwardMinh Ahtziri Hancock, PharmD, BCPS, AAHIVP, CPP Clinical Infectious Disease Pharmacist [facility] 12/22/2016, 10:48 AM

## 2016-12-22 NOTE — Patient Instructions (Signed)
Come back and see me in 2 month

## 2016-12-26 LAB — HIV-1 RNA QUANT-NO REFLEX-BLD
HIV 1 RNA Quant: <20 copies/mL
HIV-1 RNA QUANT, LOG: NOT DETECTED {Log_copies}/mL

## 2017-01-22 DIAGNOSIS — I10 Essential (primary) hypertension: Secondary | ICD-10-CM | POA: Diagnosis not present

## 2017-01-22 DIAGNOSIS — Z1389 Encounter for screening for other disorder: Secondary | ICD-10-CM | POA: Diagnosis not present

## 2017-01-22 DIAGNOSIS — B20 Human immunodeficiency virus [HIV] disease: Secondary | ICD-10-CM | POA: Diagnosis not present

## 2017-01-22 DIAGNOSIS — E7800 Pure hypercholesterolemia, unspecified: Secondary | ICD-10-CM | POA: Diagnosis not present

## 2017-01-22 DIAGNOSIS — E119 Type 2 diabetes mellitus without complications: Secondary | ICD-10-CM | POA: Diagnosis not present

## 2017-02-23 ENCOUNTER — Ambulatory Visit: Payer: BC Managed Care – PPO

## 2017-02-23 ENCOUNTER — Telehealth: Payer: Self-pay | Admitting: *Deleted

## 2017-02-23 NOTE — Telephone Encounter (Signed)
Started PA process for biktarvy via [email or web address] Andree CossHowell, Mekisha Bittel M, RN

## 2017-02-24 ENCOUNTER — Encounter: Payer: Self-pay | Admitting: Internal Medicine

## 2017-02-26 ENCOUNTER — Other Ambulatory Visit: Payer: Self-pay | Admitting: *Deleted

## 2017-02-26 ENCOUNTER — Other Ambulatory Visit: Payer: Self-pay | Admitting: Pharmacist Clinician (PhC)/ Clinical Pharmacy Specialist

## 2017-02-26 MED ORDER — BICTEGRAVIR-EMTRICITAB-TENOFOV 50-200-25 MG PO TABS: 1.0000 | ORAL_TABLET | Freq: Every day | ORAL | 11 refills | Status: DC

## 2017-03-02 ENCOUNTER — Ambulatory Visit (INDEPENDENT_AMBULATORY_CARE_PROVIDER_SITE_OTHER): Payer: BC Managed Care – PPO | Admitting: Pharmacist Clinician (PhC)/ Clinical Pharmacy Specialist

## 2017-03-02 DIAGNOSIS — B20 Human immunodeficiency virus [HIV] disease: Secondary | ICD-10-CM

## 2017-03-02 DIAGNOSIS — A150 Tuberculosis of lung: Secondary | ICD-10-CM | POA: Diagnosis not present

## 2017-03-02 NOTE — Patient Instructions (Signed)
Labs today and follow up with Dr. Orvan Falconer on 10/31 at 4 PM

## 2017-03-02 NOTE — Progress Notes (Signed)
HPI: Chloe Hancock is a 66 y.o. female who is here to f/u with [place] for her latent TB f/u.   Allergies: No Known Allergies  Vitals:    Past Medical History: Past Medical History:  Diagnosis Date  . Headache(784.0)   . HIV positive (HCC) 1998  . Irregular bleeding   . Migraines     Social History: Social History   Social History  . Marital status: Divorced    Spouse name: N/A  . Number of children: N/A  . Years of education: N/A   Social History Main Topics  . Smoking status: Never Smoker  . Smokeless tobacco: Never Used  . Alcohol use Not on file  . Drug use: No  . Sexual activity: Not Currently     Comment: declined condoms   Other Topics Concern  . Not on file   Social History Narrative  . No narrative on file    Previous Regimen: ATP, Genvoya  Current Regimen: Biktarvy  Labs: HIV 1 RNA Quant (copies/mL)  Date Value  12/22/2016 <20 NOT DETECTED  01/11/2016 <20  12/02/2014 <20   CD4 T Cell Abs (/uL)  Date Value  12/22/2016 540  01/11/2016 610  12/02/2014 630   Hep B S Ab (no units)  Date Value  09/10/2006 No   Hepatitis B Surface Ag (no units)  Date Value  09/10/2006 No   HCV Ab (no units)  Date Value  09/10/2006 No    CrCl: CrCl cannot be calculated (Patient's most recent lab result is older than the maximum 21 days allowed.).  Lipids:    Component Value Date/Time   CHOL 117 12/22/2016 1101   TRIG 79 12/22/2016 1101   HDL 50 (L) 12/22/2016 1101   CHOLHDL 2.3 12/22/2016 1101   VLDL 16 12/22/2016 1101   LDLCALC 51 12/22/2016 1101    Assessment: Chloe Hancock is in her 6th month of her INH/B6  for her LTBI (09/13/26). She has been doing very well on it. LFTs have remained WNL. She has not respiratory symptoms. She has not had any side effects and has not missed any doses of her meds. He adherence has been excellent. She called a few days ago with an issue getting her Biktarvy with the mail order specialty pharmacy. I had to submit a new PA  for it for some reason but it has resolved now. Counseled her to separate out the calcium supplement with her Biktarvy.   She received the hep B series in 2003 but the hbsab was neg in 2008. Offered her to repeat the series today but she stated that she may have gotten a series at [facility]. We will get the hbsab today. If it's neg, we will repeat the series. She was never tested for hep A ab, therefore, we'll get it today.   She will finish her LTBI treatment at the end of Oct so I'll schedule her for a visit with Dr. Orvan Falconer at that time for her HIV and LTBI follow up.   Recommendations:  CMP, hep A, B titers today Cont INH/B6 for 3 more month F/u with Dr. Orvan Falconer in Oct  Elsy Chiang, PharmD, BCPS, AAHIVP, CPP Clinical Infectious Disease Pharmacist [facility] 03/02/2017, 10:44 AM

## 2017-03-03 LAB — COMPLETE METABOLIC PANEL WITH GFR
ALT: 23 U/L (ref 6–29)
AST: 23 U/L (ref 10–35)
Albumin: 4.1 g/dL (ref 3.6–5.1)
Alkaline Phosphatase: 57 U/L (ref 33–130)
BUN: 12 mg/dL (ref 7–25)
CHLORIDE: 105 mmol/L (ref 98–110)
CO2: 26 mmol/L (ref 20–32)
Calcium: 9.3 mg/dL (ref 8.6–10.4)
Creat: 0.72 mg/dL (ref 0.50–0.99)
GFR, EST NON AFRICAN AMERICAN: 87 mL/min (ref >=60)
GFR, Est African American: >89 mL/min (ref >=60)
GLUCOSE: 110 mg/dL — AB (ref 65–99)
POTASSIUM: 4.7 mmol/L (ref 3.5–5.3)
SODIUM: 141 mmol/L (ref 135–146)
Total Bilirubin: 0.3 mg/dL (ref 0.2–1.2)
Total Protein: 6.6 g/dL (ref 6.1–8.1)

## 2017-03-03 LAB — HEPATITIS A ANTIBODY, TOTAL: Hep A Total Ab: REACTIVE — AB

## 2017-03-03 LAB — HEPATITIS B SURFACE ANTIBODY,QUALITATIVE: Hep B S Ab: NONREACTIVE

## 2017-03-07 ENCOUNTER — Encounter: Payer: Self-pay | Admitting: Internal Medicine

## 2017-03-09 ENCOUNTER — Telehealth: Payer: Self-pay | Admitting: *Deleted

## 2017-03-09 DIAGNOSIS — Z9189 Other specified personal risk factors, not elsewhere classified: Secondary | ICD-10-CM

## 2017-03-09 DIAGNOSIS — Z789 Other specified health status: Secondary | ICD-10-CM

## 2017-03-09 DIAGNOSIS — Z7184 Encounter for health counseling related to travel: Secondary | ICD-10-CM

## 2017-03-09 MED ORDER — ATOVAQUONE-PROGUANIL HCL 250-100 MG PO TABS
1.0000 | ORAL_TABLET | Freq: Every day | ORAL | 0 refills | Status: DC
Start: 2017-03-09 — End: 2018-03-29

## 2017-03-09 NOTE — Telephone Encounter (Signed)
Per Dr Orvan Falconer and Dr Drue Second, will send in prescription of malarone to [facility]. Patient will need 26 pills for her trip 8/31-9/16. She will start 8/29, will take through 9/23. Per Dr Drue Second, she will need the Typhoid injection. Patient aware that she will have to pay for this ($234) but that it will be sent to her insurance for reimbursement.  She will come Monday at 10 for typhoid injection.  She is aware that while the [employer] does not require proof of Yellow Fever vaccination for [place], she may be required to get this before leaving the airport upon arrival in country. Andree Coss, RN

## 2017-03-12 ENCOUNTER — Ambulatory Visit (INDEPENDENT_AMBULATORY_CARE_PROVIDER_SITE_OTHER): Payer: BC Managed Care – PPO | Admitting: *Deleted

## 2017-03-12 DIAGNOSIS — Z789 Other specified health status: Secondary | ICD-10-CM

## 2017-03-12 MED ORDER — TYPHOID VI POLYSACCHARIDE VACC 25 MCG/0.5ML IM SOLN
0.5000 mL | Freq: Once | INTRAMUSCULAR | Status: AC
Start: 2017-03-12 — End: 2017-03-12
  Administered 2017-03-12: 0.5 mL via INTRAMUSCULAR

## 2017-05-16 ENCOUNTER — Ambulatory Visit (INDEPENDENT_AMBULATORY_CARE_PROVIDER_SITE_OTHER): Payer: BC Managed Care – PPO | Admitting: Internal Medicine

## 2017-05-16 ENCOUNTER — Encounter: Payer: Self-pay | Admitting: Internal Medicine

## 2017-05-16 DIAGNOSIS — B20 Human immunodeficiency virus [HIV] disease: Secondary | ICD-10-CM

## 2017-05-16 DIAGNOSIS — E118 Type 2 diabetes mellitus with unspecified complications: Secondary | ICD-10-CM

## 2017-05-16 DIAGNOSIS — R7611 Nonspecific reaction to tuberculin skin test without active tuberculosis: Secondary | ICD-10-CM | POA: Diagnosis not present

## 2017-05-16 DIAGNOSIS — Z227 Latent tuberculosis: Secondary | ICD-10-CM

## 2017-05-16 MED ORDER — ISONIAZID 300 MG PO TABS: 300.0000 mg | ORAL_TABLET | Freq: Every day | ORAL | 0 refills | Status: DC

## 2017-05-16 MED ORDER — ONDANSETRON HCL 4 MG PO TABS: 4.0000 mg | ORAL_TABLET | Freq: Three times a day (TID) | ORAL | 0 refills | Status: DC | PRN

## 2017-05-16 NOTE — Assessment & Plan Note (Signed)
Her HIV remains under excellent, long-term control. She will continue Biktarvy in follow-up after lab work in 6 months.

## 2017-05-16 NOTE — Assessment & Plan Note (Signed)
She is approaching her recent diagnosis of diabetes in her usual disciplined way. She has made some significant lifestyle changes and has lost 10 pounds. She tells me that her blood sugars and hemoglobin A1c have been improving.

## 2017-05-16 NOTE — Progress Notes (Signed)
Patient Active Problem List   Diagnosis Date Noted  . Human immunodeficiency virus (HIV) disease (HCC) 07/28/2006    Priority: High  . Latent tuberculosis by blood test 06/28/2016  . GERD (gastroesophageal reflux disease) 02/15/2016  . Dyslipidemia 02/15/2016  . Irregular bleeding   . Hyperglycemia 01/03/2011  . ALLERGIC RHINITIS 11/02/2009  . HYPERTENSION NEC 11/02/2009  . Diabetes mellitus (HCC) 07/28/2006  . HEADACHE 07/28/2006    Patient's Medications  New Prescriptions   ONDANSETRON (ZOFRAN) 4 MG TABLET    Take 1 tablet (4 mg total) by mouth every 8 (eight) hours as needed for nausea or vomiting.  Previous Medications   ATOVAQUONE-PROGUANIL (MALARONE) 250-100 MG TABS TABLET    Take 1 tablet by mouth daily. Start on 8/29 and take one tablet daily until complete on 9/23.   BICTEGRAVIR-EMTRICITABINE-TENOFOVIR AF (BIKTARVY) 50-200-25 MG TABS TABLET    Take 1 tablet by mouth daily.   CALCIUM CARBONATE (OS-CAL) 600 MG TABS    Take 600 mg by mouth daily.   CHOLECALCIFEROL (VITAMIN D) 1000 UNITS TABLET    Take 1,000 Units by mouth daily.   FISH OIL-OMEGA-3 FATTY ACIDS 1000 MG CAPSULE    Take 1 g by mouth daily.     GLUCOSE BLOOD TEST STRIP    Use as instructed up to 2 x daily   MULTIPLE VITAMINS-MINERALS (MULTIVITAMIN & MINERAL PO)    Take by mouth.     QUINAPRIL-HYDROCHLOROTHIAZIDE (ACCURETIC) 20-25 MG PER TABLET    Take 0.5 tablets by mouth daily.     RA VITAMIN B-6 50 MG TABLET    take 1 tablet by mouth once daily  Modified Medications   Modified Medication Previous Medication   ISONIAZID (NYDRAZID) 300 MG TABLET isoniazid (NYDRAZID) 300 MG tablet      Take 1 tablet (300 mg total) by mouth daily.    Take 1 tablet (300 mg total) by mouth daily.  Discontinued Medications   No medications on file    Subjective: Samai is in for her routine follow-up visit with me. A lot has happened since her last visit in February. She was found to have latent tuberculosis and  started on isoniazid and vitamin B6 in late February. She has not missed any doses. She has some occasional nausea after taking it but no vomiting, abdominal pain or jaundice. Her liver enzymes have been normal since starting isoniazid. She also changed and is now taking Biktarvy for her HIV infection. She is having no problems tolerating it and never misses a dose. She also traveled back home tonight aerobic and Mar 18, 2023 following the death of a family member. She said that it was bittersweet and sad because of the death but good to see family she has not seen in 12 years. She took Malarone malaria prophylaxis without difficulty while there. She has been on a new diet and exercising regularly and attempt to lose weight and control her blood sugar. She has been making progress and has quite happy with that.  Review of Systems: Review of Systems  Constitutional: Positive for weight loss. Negative for chills, diaphoresis, fever and malaise/fatigue.  HENT: Negative for congestion and sore throat.   Respiratory: Negative for cough, sputum production and shortness of breath.   Cardiovascular: Negative for chest pain.  Gastrointestinal: Positive for nausea. Negative for abdominal pain, diarrhea, heartburn and vomiting.  Genitourinary: Negative for dysuria and frequency.  Musculoskeletal: Negative for joint pain and myalgias.  Skin: Negative for  rash.  Neurological: Negative for dizziness and headaches.  Psychiatric/Behavioral: Negative for depression and substance abuse. The patient is not nervous/anxious.     Past Medical History:  Diagnosis Date  . Headache(784.0)   . HIV positive (HCC) 1998  . Irregular bleeding   . Migraines     Social History  Substance Use Topics  . Smoking status: Never Smoker  . Smokeless tobacco: Never Used  . Alcohol use Not on file    Family History  Problem Relation Age of Onset  . Diabetes Father     No Known Allergies  Health Maintenance  Topic Date Due    . HEMOGLOBIN A1C  Apr [birth year], 1949  . FOOT EXAM  03/14/1959  . OPHTHALMOLOGY EXAM  03/14/1959  . COLONOSCOPY  03/14/1999  . DEXA SCAN  03/13/2014  . INFLUENZA VACCINE  02/14/2017  . MAMMOGRAM  11/25/2018  . TETANUS/TDAP  05/20/2025  . Hepatitis C Screening  Completed  . PNA vac Low Risk Adult  Completed    Objective:  Vitals:   05/16/17 1606  BP: 114/72  Pulse: 89  Temp: 98.4 F (36.9 C)  TempSrc: Oral  Weight: 157 lb (71.2 kg)   Body mass index is 26.13 kg/m.  Physical Exam  Constitutional: She is oriented to person, place, and time.  She is in good spirits as usual. Her weight is down 10 pounds since February.  HENT:  Mouth/Throat: No oropharyngeal exudate.  Eyes: Conjunctivae are normal.  Cardiovascular: Normal rate and regular rhythm.   No murmur heard. Pulmonary/Chest: Effort normal and breath sounds normal.  Abdominal: Soft. She exhibits no distension and no mass. There is no tenderness.  Musculoskeletal: Normal range of motion.  Neurological: She is alert and oriented to person, place, and time. Gait normal.  Skin: No rash noted.  Psychiatric: Mood and affect normal.    Lab Results Lab Results  Component Value Date   WBC 5.0 12/22/2016   HGB 12.7 12/22/2016   HCT 38.6 12/22/2016   MCV 90.6 12/22/2016   PLT 194 12/22/2016    Lab Results  Component Value Date   CREATININE 0.72 03/02/2017   BUN 12 03/02/2017   NA 141 03/02/2017   K 4.7 03/02/2017   CL 105 03/02/2017   CO2 26 03/02/2017    Lab Results  Component Value Date   ALT 23 03/02/2017   AST 23 03/02/2017   ALKPHOS 57 03/02/2017   BILITOT 0.3 03/02/2017    Lab Results  Component Value Date   CHOL 117 12/22/2016   HDL 50 (L) 12/22/2016   LDLCALC 51 12/22/2016   TRIG 79 12/22/2016   CHOLHDL 2.3 12/22/2016   Lab Results  Component Value Date   LABRPR NON REAC 12/02/2014   HIV 1 RNA Quant (copies/mL)  Date Value  12/22/2016 <20 NOT DETECTED  01/11/2016 <20  12/02/2014 <20   CD4 T  Cell Abs (/uL)  Date Value  12/22/2016 540  01/11/2016 610  12/02/2014 630     Problem List Items Addressed This Visit      High   Human immunodeficiency virus (HIV) disease (HCC)    Her HIV remains under excellent, long-term control. She will continue Biktarvy in follow-up after lab work in 6 months.      Relevant Medications   isoniazid (NYDRAZID) 300 MG tablet   Other Relevant Orders   T-helper cell (CD4)- ([facility] clinic only)   HIV 1 RNA quant-no reflex-bld   CBC   Comprehensive metabolic panel   Lipid panel  RPR     Unprioritized   Diabetes mellitus (HCC)    She is approaching her recent diagnosis of diabetes in her usual disciplined way. She has made some significant lifestyle changes and has lost 10 pounds. She tells me that her blood sugars and hemoglobin A1c have been improving.      Latent tuberculosis by blood test    She is now completed 8 months of therapy for latent tuberculosis. She will finish therapy after one more month then stop. I will give her some ondansetron to take as needed for nausea.      Relevant Medications   ondansetron (ZOFRAN) 4 MG tablet    Other Visit Diagnoses    Latent tuberculosis       Relevant Medications   isoniazid (NYDRAZID) 300 MG tablet        Cliffton AstersJohn Halima Fogal, MD [facility] [phone] pager   [phone] cell 05/16/2017, 4:59 PM

## 2017-05-16 NOTE — Assessment & Plan Note (Signed)
She is now completed 8 months of therapy for latent tuberculosis. She will finish therapy after one more month then stop. I will give her some ondansetron to take as needed for nausea.

## 2017-06-18 DIAGNOSIS — Z6826 Body mass index (BMI) 26.0-26.9, adult: Secondary | ICD-10-CM | POA: Diagnosis not present

## 2017-06-18 DIAGNOSIS — B20 Human immunodeficiency virus [HIV] disease: Secondary | ICD-10-CM | POA: Diagnosis not present

## 2017-06-18 DIAGNOSIS — Z01419 Encounter for gynecological examination (general) (routine) without abnormal findings: Secondary | ICD-10-CM | POA: Diagnosis not present

## 2017-06-18 DIAGNOSIS — Z124 Encounter for screening for malignant neoplasm of cervix: Secondary | ICD-10-CM | POA: Diagnosis not present

## 2017-10-19 ENCOUNTER — Other Ambulatory Visit: Payer: Self-pay | Admitting: Obstetrics and Gynecology

## 2017-10-19 DIAGNOSIS — Z1231 Encounter for screening mammogram for malignant neoplasm of breast: Secondary | ICD-10-CM

## 2017-11-26 ENCOUNTER — Ambulatory Visit
Admission: RE | Admit: 2017-11-26 | Discharge: 2017-11-26 | Disposition: A | Discharge status: Home / Self Care | Payer: BC Managed Care – PPO | Source: Ambulatory Visit | Attending: Obstetrics and Gynecology | Admitting: Obstetrics and Gynecology

## 2017-11-26 DIAGNOSIS — Z1231 Encounter for screening mammogram for malignant neoplasm of breast: Secondary | ICD-10-CM

## 2018-01-22 DIAGNOSIS — E7800 Pure hypercholesterolemia, unspecified: Secondary | ICD-10-CM | POA: Diagnosis not present

## 2018-01-22 DIAGNOSIS — E119 Type 2 diabetes mellitus without complications: Secondary | ICD-10-CM | POA: Diagnosis not present

## 2018-01-22 DIAGNOSIS — B20 Human immunodeficiency virus [HIV] disease: Secondary | ICD-10-CM | POA: Diagnosis not present

## 2018-01-22 DIAGNOSIS — I10 Essential (primary) hypertension: Secondary | ICD-10-CM | POA: Diagnosis not present

## 2018-02-26 ENCOUNTER — Telehealth: Payer: Self-pay | Admitting: *Deleted

## 2018-02-26 ENCOUNTER — Other Ambulatory Visit: Payer: Self-pay | Admitting: *Deleted

## 2018-02-26 DIAGNOSIS — B20 Human immunodeficiency virus [HIV] disease: Secondary | ICD-10-CM

## 2018-02-26 MED ORDER — BICTEGRAVIR-EMTRICITAB-TENOFOV 50-200-25 MG PO TABS: 1.0000 | ORAL_TABLET | Freq: Every day | ORAL | 0 refills | Status: DC

## 2018-02-26 NOTE — Telephone Encounter (Signed)
RN called to schedule appointment, as patient was last seen 04/2017. [facility] SHe will come 9/4 for labs, 9/16 with Dr Orvan Falconerampbell. She will need refills at her appointment. Andree CossHowell, Augustina Braddock M, RN

## 2018-03-20 ENCOUNTER — Other Ambulatory Visit: Payer: BC Managed Care – PPO

## 2018-03-20 DIAGNOSIS — B20 Human immunodeficiency virus [HIV] disease: Secondary | ICD-10-CM

## 2018-03-21 LAB — T-HELPER CELL (CD4) - (RCID CLINIC ONLY)
CD4 % Helper T Cell: 27 % — ABNORMAL LOW (ref 33–55)
CD4 T CELL ABS: 510 /uL (ref 400–2700)

## 2018-03-22 LAB — COMPREHENSIVE METABOLIC PANEL
AG Ratio: 1.8 (calc) (ref 1.0–2.5)
ALBUMIN MSPROF: 4.2 g/dL (ref 3.6–5.1)
ALKALINE PHOSPHATASE (APISO): 54 U/L (ref 33–130)
ALT: 17 U/L (ref 6–29)
AST: 19 U/L (ref 10–35)
BUN: 11 mg/dL (ref 7–25)
CHLORIDE: 106 mmol/L (ref 98–110)
CO2: 27 mmol/L (ref 20–32)
Calcium: 9.6 mg/dL (ref 8.6–10.4)
Creat: 0.78 mg/dL (ref 0.50–0.99)
Globulin: 2.3 g/dL (calc) (ref 1.9–3.7)
Glucose, Bld: 109 mg/dL — ABNORMAL HIGH (ref 65–99)
POTASSIUM: 4.8 mmol/L (ref 3.5–5.3)
Sodium: 140 mmol/L (ref 135–146)
Total Bilirubin: 0.3 mg/dL (ref 0.2–1.2)
Total Protein: 6.5 g/dL (ref 6.1–8.1)

## 2018-03-22 LAB — CBC
HCT: 38 % (ref 35.0–45.0)
HEMOGLOBIN: 12.7 g/dL (ref 11.7–15.5)
MCH: 29.8 pg (ref 27.0–33.0)
MCHC: 33.4 g/dL (ref 32.0–36.0)
MCV: 89.2 fL (ref 80.0–100.0)
MPV: 10.8 fL (ref 7.5–12.5)
Platelets: 196 10*3/uL (ref 140–400)
RBC: 4.26 10*6/uL (ref 3.80–5.10)
RDW: 13.6 % (ref 11.0–15.0)
WBC: 4.1 10*3/uL (ref 3.8–10.8)

## 2018-03-22 LAB — HIV-1 RNA QUANT-NO REFLEX-BLD
HIV 1 RNA Quant: <20 copies/mL
HIV-1 RNA Quant, Log: <1.3 Log copies/mL

## 2018-03-22 LAB — LIPID PANEL
CHOL/HDL RATIO: 2.1 (calc) (ref <5.0)
Cholesterol: 149 mg/dL (ref <200)
HDL: 70 mg/dL (ref >50)
LDL Cholesterol (Calc): 64 mg/dL (calc)
Non-HDL Cholesterol (Calc): 79 mg/dL (calc) (ref <130)
Triglycerides: 74 mg/dL (ref <150)

## 2018-03-22 LAB — RPR: RPR Ser Ql: NONREACTIVE

## 2018-03-29 ENCOUNTER — Other Ambulatory Visit: Payer: Self-pay | Admitting: Internal Medicine

## 2018-03-29 DIAGNOSIS — B20 Human immunodeficiency virus [HIV] disease: Secondary | ICD-10-CM

## 2018-04-01 ENCOUNTER — Other Ambulatory Visit: Payer: Self-pay

## 2018-04-01 ENCOUNTER — Other Ambulatory Visit: Payer: Self-pay | Admitting: Pharmacist

## 2018-04-01 ENCOUNTER — Ambulatory Visit (INDEPENDENT_AMBULATORY_CARE_PROVIDER_SITE_OTHER): Payer: BC Managed Care – PPO | Admitting: Internal Medicine

## 2018-04-01 ENCOUNTER — Encounter: Payer: Self-pay | Admitting: Internal Medicine

## 2018-04-01 ENCOUNTER — Other Ambulatory Visit: Payer: Self-pay | Admitting: *Deleted

## 2018-04-01 DIAGNOSIS — Z23 Encounter for immunization: Secondary | ICD-10-CM | POA: Diagnosis not present

## 2018-04-01 DIAGNOSIS — R7611 Nonspecific reaction to tuberculin skin test without active tuberculosis: Secondary | ICD-10-CM

## 2018-04-01 DIAGNOSIS — Z227 Latent tuberculosis: Secondary | ICD-10-CM

## 2018-04-01 DIAGNOSIS — B20 Human immunodeficiency virus [HIV] disease: Secondary | ICD-10-CM

## 2018-04-01 NOTE — Assessment & Plan Note (Signed)
Her infection is under excellent, long-term control.  She received her influenza vaccination today.  She will continue Biktarvy and follow-up after lab work in 1 year.

## 2018-04-01 NOTE — Assessment & Plan Note (Signed)
She completed treatment for latent tuberculosis in November 2018.  She tolerated isoniazid very well.

## 2018-04-01 NOTE — Progress Notes (Signed)
HPI: Chloe Hancock is a 67 y.o. female who presents to the [facility] today to see Dr. Orvan Falconer.  Patient Active Problem List   Diagnosis Date Noted  . Latent tuberculosis by blood test 06/28/2016  . GERD (gastroesophageal reflux disease) 02/15/2016  . Dyslipidemia 02/15/2016  . Irregular bleeding   . ALLERGIC RHINITIS 11/02/2009  . HYPERTENSION NEC 11/02/2009  . Human immunodeficiency virus (HIV) disease (HCC) 07/28/2006  . Diabetes mellitus (HCC) 07/28/2006  . HEADACHE 07/28/2006    Patient's Medications  New Prescriptions   No medications on file  Previous Medications   BIKTARVY 50-200-25 MG TABS TABLET    TAKE 1 TABLET BY MOUTH DAILY.   BLACK COHOSH-FLAXSEED-SOY (MENOPAUSE TRIO PO)    Take by mouth.   CALCIUM CARBONATE (OS-CAL) 600 MG TABS    Take 600 mg by mouth daily.   CHOLECALCIFEROL (VITAMIN D) 1000 UNITS TABLET    Take 1,000 Units by mouth daily.   FISH OIL-OMEGA-3 FATTY ACIDS 1000 MG CAPSULE    Take 1 g by mouth daily.     GLUCOSE BLOOD TEST STRIP    Use as instructed up to 2 x daily   MULTIPLE VITAMINS-MINERALS (MULTIVITAMIN & MINERAL PO)    Take by mouth.     ONDANSETRON (ZOFRAN) 4 MG TABLET    Take 1 tablet (4 mg total) by mouth every 8 (eight) hours as needed for nausea or vomiting.   QUINAPRIL (ACCUPRIL) 10 MG TABLET    Take 10 mg by mouth daily.   QUINAPRIL-HYDROCHLOROTHIAZIDE (ACCURETIC) 20-25 MG PER TABLET    Take 0.5 tablets by mouth daily.     RA VITAMIN B-6 50 MG TABLET    take 1 tablet by mouth once daily   ROSUVASTATIN (CRESTOR) 10 MG TABLET    Take 10 mg by mouth daily.  Modified Medications   No medications on file  Discontinued Medications   ATOVAQUONE-PROGUANIL (MALARONE) 250-100 MG TABS TABLET    Take 1 tablet by mouth daily. Start on 8/29 and take one tablet daily until complete on 9/23.   ISONIAZID (NYDRAZID) 300 MG TABLET    Take 1 tablet (300 mg total) by mouth daily.    Allergies: No Known Allergies  Past Medical History: Past Medical  History:  Diagnosis Date  . Headache(784.0)   . HIV positive (HCC) 1998  . Irregular bleeding   . Migraines     Social History: Social History   Socioeconomic History  . Marital status: Divorced    Spouse name: Not on file  . Number of children: Not on file  . Years of education: Not on file  . Highest education level: Not on file  Occupational History  . Not on file  Social Needs  . Financial resource strain: Not on file  . Food insecurity:    Worry: Not on file    Inability: Not on file  . Transportation needs:    Medical: Not on file    Non-medical: Not on file  Tobacco Use  . Smoking status: Never Smoker  . Smokeless tobacco: Never Used  Substance and Sexual Activity  . Alcohol use: Not on file  . Drug use: No  . Sexual activity: Not Currently    Comment: declined condoms  Lifestyle  . Physical activity:    Days per week: Not on file    Minutes per session: Not on file  . Stress: Not on file  Relationships  . Social connections:    Talks on phone:  Not on file    Gets together: Not on file    Attends religious service: Not on file    Active member of club or organization: Not on file    Attends meetings of clubs or organizations: Not on file    Relationship status: Not on file  Other Topics Concern  . Not on file  Social History Narrative  . Not on file    Labs: Lab Results  Component Value Date   HIV1RNAQUANT <20 NOT DETECTED 03/20/2018   HIV1RNAQUANT <20 NOT DETECTED 12/22/2016   HIV1RNAQUANT <20 01/11/2016   CD4TABS 510 03/20/2018   CD4TABS 540 12/22/2016   CD4TABS 610 01/11/2016    RPR and STI Lab Results  Component Value Date   LABRPR NON-REACTIVE 03/20/2018   LABRPR NON REAC 12/02/2014   LABRPR NON REAC 06/30/2013   LABRPR NON REAC 12/24/2012   LABRPR NON REAC 06/03/2012    No flowsheet data found.  Hepatitis B Lab Results  Component Value Date   HEPBSAB NON-REACTIVE 03/02/2017   HEPBSAG No 09/10/2006   Hepatitis C No  results found for: HEPCAB, HCVRNAPCRQN Hepatitis A Lab Results  Component Value Date   HAV REACTIVE (A) 03/02/2017   Lipids: Lab Results  Component Value Date   CHOL 149 03/20/2018   TRIG 74 03/20/2018   HDL 70 03/20/2018   CHOLHDL 2.1 03/20/2018   VLDL 16 12/22/2016   LDLCALC 64 03/20/2018    Current HIV Regimen: Biktarvy  Assessment: Chloe H[facility] which is not surprising.  She states she has a co-pay card for her Susanne BordersBiktarvy but [facility] and she has to call and remind them every time.  Unfortunately, this happens all the time with different specialty pharmacies.  [facility] her [employer] insurance [facility]  She will call her insurance and ask them if she can fill locally. She will let us know.  Plan: - Continue Biktarvy PO once daily - Call to see if she can fill locally  Cassie L. Kuppelweiser, PharmD, AAHIVP, CPP Infectious Diseases Clinical Pharmacist [facility] 04/01/2018, 10:31 AM

## 2018-04-01 NOTE — Progress Notes (Signed)
Patient Active Problem List   Diagnosis Date Noted  . Human immunodeficiency virus (HIV) disease (HCC) 07/28/2006    Priority: High  . Latent tuberculosis by blood test 06/28/2016  . GERD (gastroesophageal reflux disease) 02/15/2016  . Dyslipidemia 02/15/2016  . Irregular bleeding   . ALLERGIC RHINITIS 11/02/2009  . HYPERTENSION NEC 11/02/2009  . Diabetes mellitus (HCC) 07/28/2006  . HEADACHE 07/28/2006    Patient's Medications  New Prescriptions   No medications on file  Previous Medications   BIKTARVY 50-200-25 MG TABS TABLET    TAKE 1 TABLET BY MOUTH DAILY.   BLACK COHOSH-FLAXSEED-SOY (MENOPAUSE TRIO PO)    Take by mouth.   CALCIUM CARBONATE (OS-CAL) 600 MG TABS    Take 600 mg by mouth daily.   CHOLECALCIFEROL (VITAMIN D) 1000 UNITS TABLET    Take 1,000 Units by mouth daily.   FISH OIL-OMEGA-3 FATTY ACIDS 1000 MG CAPSULE    Take 1 g by mouth daily.     GLUCOSE BLOOD TEST STRIP    Use as instructed up to 2 x daily   MULTIPLE VITAMINS-MINERALS (MULTIVITAMIN & MINERAL PO)    Take by mouth.     ONDANSETRON (ZOFRAN) 4 MG TABLET    Take 1 tablet (4 mg total) by mouth every 8 (eight) hours as needed for nausea or vomiting.   QUINAPRIL (ACCUPRIL) 10 MG TABLET    Take 10 mg by mouth daily.   QUINAPRIL-HYDROCHLOROTHIAZIDE (ACCURETIC) 20-25 MG PER TABLET    Take 0.5 tablets by mouth daily.     RA VITAMIN B-6 50 MG TABLET    take 1 tablet by mouth once daily   ROSUVASTATIN (CRESTOR) 10 MG TABLET    Take 10 mg by mouth daily.  Modified Medications   No medications on file  Discontinued Medications   ATOVAQUONE-PROGUANIL (MALARONE) 250-100 MG TABS TABLET    Take 1 tablet by mouth daily. Start on 8/29 and take one tablet daily until complete on 9/23.   ISONIAZID (NYDRAZID) 300 MG TABLET    Take 1 tablet (300 mg total) by mouth daily.    Subjective: Chloe Hancock is in for her routine HIV follow-up visit.  She has had no problems obtaining, taking her tolerating Biktarvy but she says  that she does get harassed each month by [facility] about her co-pay.  She has co-pay cards and has not had to pay each month but she continues to get bills.  She completed her 9 months of isoniazid last November for latent tuberculosis.  She has been able to control her diabetes without medications.  1 of her sisters recently died following a stroke.  Review of Systems: Review of Systems  Constitutional: Negative for chills, diaphoresis and fever.  Respiratory: Negative for cough, sputum production and shortness of breath.   Cardiovascular: Negative for chest pain.  Gastrointestinal: Negative for abdominal pain, diarrhea, nausea and vomiting.  Musculoskeletal:       Frequent nocturnal leg cramps.  Psychiatric/Behavioral: Negative for depression.    Past Medical History:  Diagnosis Date  . Headache(784.0)   . HIV positive (HCC) 1998  . Irregular bleeding   . Migraines     Social History   Tobacco Use  . Smoking status: Never Smoker  . Smokeless tobacco: Never Used  Substance Use Topics  . Alcohol use: Not on file  . Drug use: No    Family History  Problem Relation Age of Onset  . Diabetes Father  No Known Allergies  Health Maintenance  Topic Date Due  . HEMOGLOBIN A1C  November 13, 1950  . FOOT EXAM  03/14/1959  . OPHTHALMOLOGY EXAM  03/14/1959  . COLONOSCOPY  03/14/1999  . DEXA SCAN  03/13/2014  . INFLUENZA VACCINE  02/14/2018  . MAMMOGRAM  11/27/2019  . TETANUS/TDAP  05/20/2025  . Hepatitis C Screening  Completed  . PNA vac Low Risk Adult  Completed    Objective:  Vitals:   04/01/18 0944  BP: 127/79  Pulse: 80  Temp: 97.7 F (36.5 C)  TempSrc: Oral  Weight: 156 lb (70.8 kg)   Body mass index is 25.96 kg/m.  Physical Exam  Constitutional: She is oriented to person, place, and time.  She is in very good spirits as usual.  HENT:  Mouth/Throat: No oropharyngeal exudate.  Cardiovascular: Normal rate, regular rhythm and normal heart sounds.    Pulmonary/Chest: Effort normal and breath sounds normal.  Abdominal: Soft. She exhibits no distension. There is no tenderness.  Neurological: She is alert and oriented to person, place, and time.  Skin: No rash noted.  Psychiatric: She has a normal mood and affect.    Lab Results Lab Results  Component Value Date   WBC 4.1 03/20/2018   HGB 12.7 03/20/2018   HCT 38.0 03/20/2018   MCV 89.2 03/20/2018   PLT 196 03/20/2018    Lab Results  Component Value Date   CREATININE 0.78 03/20/2018   BUN 11 03/20/2018   NA 140 03/20/2018   K 4.8 03/20/2018   CL 106 03/20/2018   CO2 27 03/20/2018    Lab Results  Component Value Date   ALT 17 03/20/2018   AST 19 03/20/2018   ALKPHOS 57 03/02/2017   BILITOT 0.3 03/20/2018    Lab Results  Component Value Date   CHOL 149 03/20/2018   HDL 70 03/20/2018   LDLCALC 64 03/20/2018   TRIG 74 03/20/2018   CHOLHDL 2.1 03/20/2018   Lab Results  Component Value Date   LABRPR NON-REACTIVE 03/20/2018   HIV 1 RNA Quant (copies/mL)  Date Value  03/20/2018 <20 NOT DETECTED  12/22/2016 <20 NOT DETECTED  01/11/2016 <20   CD4 T Cell Abs (/uL)  Date Value  03/20/2018 510  12/22/2016 540  01/11/2016 610     Problem List Items Addressed This Visit      High   Human immunodeficiency virus (HIV) disease (HCC)    Her infection is under excellent, long-term control.  She received her influenza vaccination today.  She will continue Biktarvy and follow-up after lab work in 1 year.      Relevant Orders   CBC   T-helper cell (CD4)- ([facility] clinic only)   Comprehensive metabolic panel   Lipid panel   RPR   HIV-1 RNA quant-no reflex-bld     Unprioritized   Latent tuberculosis by blood test    She completed treatment for latent tuberculosis in November 2018.  She tolerated isoniazid very well.           Cliffton Asters, MD [facility] [phone] pager   [phone] cell 04/01/2018,  10:00 AM

## 2018-05-30 ENCOUNTER — Other Ambulatory Visit: Payer: Self-pay | Admitting: Internal Medicine

## 2018-05-30 DIAGNOSIS — B20 Human immunodeficiency virus [HIV] disease: Secondary | ICD-10-CM

## 2018-06-25 DIAGNOSIS — Z13820 Encounter for screening for osteoporosis: Secondary | ICD-10-CM | POA: Diagnosis not present

## 2018-06-25 DIAGNOSIS — Z1211 Encounter for screening for malignant neoplasm of colon: Secondary | ICD-10-CM | POA: Diagnosis not present

## 2018-06-25 DIAGNOSIS — Z01419 Encounter for gynecological examination (general) (routine) without abnormal findings: Secondary | ICD-10-CM | POA: Diagnosis not present

## 2018-06-25 DIAGNOSIS — Z124 Encounter for screening for malignant neoplasm of cervix: Secondary | ICD-10-CM | POA: Diagnosis not present

## 2018-06-25 DIAGNOSIS — Z1239 Encounter for other screening for malignant neoplasm of breast: Secondary | ICD-10-CM | POA: Diagnosis not present

## 2018-06-25 DIAGNOSIS — Z6828 Body mass index (BMI) 28.0-28.9, adult: Secondary | ICD-10-CM | POA: Diagnosis not present

## 2018-07-30 DIAGNOSIS — E119 Type 2 diabetes mellitus without complications: Secondary | ICD-10-CM | POA: Diagnosis not present

## 2018-07-30 DIAGNOSIS — B20 Human immunodeficiency virus [HIV] disease: Secondary | ICD-10-CM | POA: Diagnosis not present

## 2018-07-30 DIAGNOSIS — Z0000 Encounter for general adult medical examination without abnormal findings: Secondary | ICD-10-CM | POA: Diagnosis not present

## 2018-07-30 DIAGNOSIS — E7800 Pure hypercholesterolemia, unspecified: Secondary | ICD-10-CM | POA: Diagnosis not present

## 2018-07-30 DIAGNOSIS — I10 Essential (primary) hypertension: Secondary | ICD-10-CM | POA: Diagnosis not present

## 2018-07-30 DIAGNOSIS — G44229 Chronic tension-type headache, not intractable: Secondary | ICD-10-CM | POA: Diagnosis not present

## 2018-11-05 ENCOUNTER — Other Ambulatory Visit: Payer: Self-pay | Admitting: Obstetrics and Gynecology

## 2018-11-05 DIAGNOSIS — Z1231 Encounter for screening mammogram for malignant neoplasm of breast: Secondary | ICD-10-CM

## 2019-01-06 ENCOUNTER — Other Ambulatory Visit: Payer: Self-pay

## 2019-01-06 ENCOUNTER — Ambulatory Visit
Admission: RE | Admit: 2019-01-06 | Discharge: 2019-01-06 | Disposition: A | Discharge status: Home / Self Care | Payer: BC Managed Care – PPO | Source: Ambulatory Visit | Attending: Obstetrics and Gynecology | Admitting: Obstetrics and Gynecology

## 2019-01-06 DIAGNOSIS — Z1231 Encounter for screening mammogram for malignant neoplasm of breast: Secondary | ICD-10-CM

## 2019-01-23 ENCOUNTER — Other Ambulatory Visit: Payer: Self-pay | Admitting: Internal Medicine

## 2019-01-23 DIAGNOSIS — B20 Human immunodeficiency virus [HIV] disease: Secondary | ICD-10-CM

## 2019-01-28 DIAGNOSIS — B20 Human immunodeficiency virus [HIV] disease: Secondary | ICD-10-CM | POA: Diagnosis not present

## 2019-01-28 DIAGNOSIS — E7800 Pure hypercholesterolemia, unspecified: Secondary | ICD-10-CM | POA: Diagnosis not present

## 2019-01-28 DIAGNOSIS — E119 Type 2 diabetes mellitus without complications: Secondary | ICD-10-CM | POA: Diagnosis not present

## 2019-01-28 DIAGNOSIS — I10 Essential (primary) hypertension: Secondary | ICD-10-CM | POA: Diagnosis not present

## 2019-01-28 DIAGNOSIS — G44229 Chronic tension-type headache, not intractable: Secondary | ICD-10-CM | POA: Diagnosis not present

## 2019-04-02 ENCOUNTER — Other Ambulatory Visit: Payer: Self-pay

## 2019-04-02 ENCOUNTER — Other Ambulatory Visit: Payer: BC Managed Care – PPO

## 2019-04-02 DIAGNOSIS — B20 Human immunodeficiency virus [HIV] disease: Secondary | ICD-10-CM

## 2019-04-04 LAB — HELPER T-LYMPH-CD4 (ARMC ONLY)
% CD 4 Pos. Lymph.: 25.3 % — ABNORMAL LOW (ref 30.8–58.5)
Absolute CD 4 Helper: 405 /uL (ref 359–1519)
Basophils Absolute: 0 10*3/uL (ref 0.0–0.2)
Basos: 1 %
EOS (ABSOLUTE): 0 10*3/uL (ref 0.0–0.4)
Eos: 1 %
Hematocrit: 41 % (ref 34.0–46.6)
Hemoglobin: 12.9 g/dL (ref 11.1–15.9)
Immature Grans (Abs): 0 10*3/uL (ref 0.0–0.1)
Immature Granulocytes: 0 %
Lymphocytes Absolute: 1.6 10*3/uL (ref 0.7–3.1)
Lymphs: 37 %
MCH: 29.6 pg (ref 26.6–33.0)
MCHC: 31.5 g/dL (ref 31.5–35.7)
MCV: 94 fL (ref 79–97)
Monocytes Absolute: 0.4 10*3/uL (ref 0.1–0.9)
Monocytes: 10 %
Neutrophils Absolute: 2.2 10*3/uL (ref 1.4–7.0)
Neutrophils: 51 %
Platelets: 207 10*3/uL (ref 150–450)
RBC: 4.36 x10E6/uL (ref 3.77–5.28)
RDW: 13.8 % (ref 11.7–15.4)
WBC: 4.3 10*3/uL (ref 3.4–10.8)

## 2019-04-06 LAB — COMPREHENSIVE METABOLIC PANEL
AG Ratio: 1.7 (calc) (ref 1.0–2.5)
ALT: 18 U/L (ref 6–29)
AST: 18 U/L (ref 10–35)
Albumin: 4.2 g/dL (ref 3.6–5.1)
Alkaline phosphatase (APISO): 59 U/L (ref 37–153)
BUN: 16 mg/dL (ref 7–25)
CO2: 26 mmol/L (ref 20–32)
Calcium: 9.3 mg/dL (ref 8.6–10.4)
Chloride: 108 mmol/L (ref 98–110)
Creat: 0.69 mg/dL (ref 0.60–0.93)
Globulin: 2.5 g/dL (calc) (ref 1.9–3.7)
Glucose, Bld: 124 mg/dL — ABNORMAL HIGH (ref 65–99)
Potassium: 4.4 mmol/L (ref 3.5–5.3)
Sodium: 140 mmol/L (ref 135–146)
Total Bilirubin: 0.3 mg/dL (ref 0.2–1.2)
Total Protein: 6.7 g/dL (ref 6.1–8.1)

## 2019-04-06 LAB — CBC
HCT: 38.8 % (ref 35.0–45.0)
Hemoglobin: 13 g/dL (ref 11.7–15.5)
MCH: 30.2 pg (ref 27.0–33.0)
MCHC: 33.5 g/dL (ref 32.0–36.0)
MCV: 90.2 fL (ref 80.0–100.0)
MPV: 10.7 fL (ref 7.5–12.5)
Platelets: 213 10*3/uL (ref 140–400)
RBC: 4.3 10*6/uL (ref 3.80–5.10)
RDW: 13.7 % (ref 11.0–15.0)
WBC: 4.5 10*3/uL (ref 3.8–10.8)

## 2019-04-06 LAB — HIV-1 RNA QUANT-NO REFLEX-BLD
HIV 1 RNA Quant: <20 copies/mL
HIV-1 RNA Quant, Log: <1.3 Log copies/mL

## 2019-04-06 LAB — RPR: RPR Ser Ql: NONREACTIVE

## 2019-04-16 ENCOUNTER — Encounter: Payer: Self-pay | Admitting: Internal Medicine

## 2019-04-16 ENCOUNTER — Other Ambulatory Visit: Payer: Self-pay

## 2019-04-16 ENCOUNTER — Ambulatory Visit (INDEPENDENT_AMBULATORY_CARE_PROVIDER_SITE_OTHER): Payer: BC Managed Care – PPO | Admitting: Internal Medicine

## 2019-04-16 DIAGNOSIS — Z23 Encounter for immunization: Secondary | ICD-10-CM

## 2019-04-16 DIAGNOSIS — B20 Human immunodeficiency virus [HIV] disease: Secondary | ICD-10-CM | POA: Diagnosis not present

## 2019-04-16 MED ORDER — BIKTARVY 50-200-25 MG PO TABS
1.0000 | ORAL_TABLET | Freq: Every day | ORAL | 11 refills | Status: DC
Start: 2019-04-16 — End: 2019-06-02

## 2019-04-16 NOTE — Progress Notes (Signed)
Patient Active Problem List   Diagnosis Date Noted  . Human immunodeficiency virus (HIV) disease ([place]) 07/28/2006    Priority: High  . Latent tuberculosis by blood test 06/28/2016  . GERD (gastroesophageal reflux disease) 02/15/2016  . Dyslipidemia 02/15/2016  . Irregular bleeding   . ALLERGIC RHINITIS 11/02/2009  . HYPERTENSION NEC 11/02/2009  . Diabetes mellitus ([place]) 07/28/2006  . HEADACHE 07/28/2006    Patient's Medications  New Prescriptions   No medications on file  Previous Medications   BLACK COHOSH-FLAXSEED-SOY (MENOPAUSE TRIO PO)    Take by mouth.   CALCIUM CARBONATE (OS-CAL) 600 MG TABS    Take 600 mg by mouth daily.   CHOLECALCIFEROL (VITAMIN D) 1000 UNITS TABLET    Take 1,000 Units by mouth daily.   FISH OIL-OMEGA-3 FATTY ACIDS 1000 MG CAPSULE    Take 1 g by mouth daily.     GLUCOSE BLOOD TEST STRIP    Use as instructed up to 2 x daily   MULTIPLE VITAMINS-MINERALS (MULTIVITAMIN & MINERAL PO)    Take by mouth.     ONDANSETRON (ZOFRAN) 4 MG TABLET    Take 1 tablet (4 mg total) by mouth every 8 (eight) hours as needed for nausea or vomiting.   QUINAPRIL-HYDROCHLOROTHIAZIDE (ACCURETIC) 20-25 MG PER TABLET    Take 0.5 tablets by mouth daily.     RA VITAMIN B-6 50 MG TABLET    take 1 tablet by mouth once daily   ROSUVASTATIN (CRESTOR) 10 MG TABLET    Take 10 mg by mouth daily.  Modified Medications   Modified Medication Previous Medication   BICTEGRAVIR-EMTRICITABINE-TENOFOVIR AF (BIKTARVY) 50-200-25 MG TABS TABLET BIKTARVY 50-200-25 MG TABS tablet      Take 1 tablet by mouth daily.    TAKE 1 TABLET BY MOUTH DAILY.  Discontinued Medications   QUINAPRIL (ACCUPRIL) 10 MG TABLET    Take 10 mg by mouth daily.    Subjective: Chloe Hancock is in for her routine HIV follow-up visit.  She has had no problems obtaining, taking her tolerating Biktarvy and, as usual, she never misses a single dose.  She is feeling well but is concerned because she had a very brief exposure to  a coworker on 04/11/2019 who was later diagnosed with COVID.  They were socially distancing in an office and both had masks on.  Chloe Hancock is feeling well.  Review of Systems: Review of Systems  Constitutional: Negative for chills, diaphoresis and fever.  Respiratory: Negative for cough, sputum production and shortness of breath.   Cardiovascular: Negative for chest pain.  Gastrointestinal: Negative for abdominal pain, diarrhea, nausea and vomiting.  Psychiatric/Behavioral: Negative for depression.    Past Medical History:  Diagnosis Date  . Headache(784.0)   . HIV positive ([place]) 1998  . Irregular bleeding   . Migraines     Social History   Tobacco Use  . Smoking status: Never Smoker  . Smokeless tobacco: Never Used  Substance Use Topics  . Alcohol use: Not on file  . Drug use: No    Family History  Problem Relation Age of Onset  . Diabetes Father     No Known Allergies  Health Maintenance  Topic Date Due  . HEMOGLOBIN A1C  1951/03/30  . FOOT EXAM  03/14/1959  . OPHTHALMOLOGY EXAM  03/14/1959  . COLONOSCOPY  03/14/1999  . DEXA SCAN  03/13/2014  . INFLUENZA VACCINE  02/15/2019  . MAMMOGRAM  01/05/2021  . TETANUS/TDAP  05/20/2025  .  Hepatitis C Screening  Completed  . PNA vac Low Risk Adult  Completed    Objective:  Vitals:   04/16/19 1026  BP: (!) 154/81  Pulse: 94  Temp: 97.9 F (36.6 C)  SpO2: 100%   There is no height or weight on file to calculate BMI.  Physical Exam Constitutional:      Comments: She is in very good spirits as usual.  HENT:     Mouth/Throat:     Pharynx: No oropharyngeal exudate.  Cardiovascular:     Rate and Rhythm: Normal rate and regular rhythm.     Heart sounds: Normal heart sounds.  Pulmonary:     Effort: Pulmonary effort is normal.     Breath sounds: Normal breath sounds.  Abdominal:     General: There is no distension.     Palpations: Abdomen is soft.     Tenderness: There is no abdominal tenderness.  Skin:     Findings: No rash.  Neurological:     Mental Status: She is alert and oriented to person, place, and time.     Lab Results Lab Results  Component Value Date   WBC 4.5 04/02/2019   HGB 13.0 04/02/2019   HCT 38.8 04/02/2019   MCV 90.2 04/02/2019   PLT 213 04/02/2019    Lab Results  Component Value Date   CREATININE 0.69 04/02/2019   BUN 16 04/02/2019   NA 140 04/02/2019   K 4.4 04/02/2019   CL 108 04/02/2019   CO2 26 04/02/2019    Lab Results  Component Value Date   ALT 18 04/02/2019   AST 18 04/02/2019   ALKPHOS 57 03/02/2017   BILITOT 0.3 04/02/2019    Lab Results  Component Value Date   CHOL 149 03/20/2018   HDL 70 03/20/2018   LDLCALC 64 03/20/2018   TRIG 74 03/20/2018   CHOLHDL 2.1 03/20/2018   Lab Results  Component Value Date   LABRPR NON-REACTIVE 04/02/2019   HIV 1 RNA Quant (copies/mL)  Date Value  04/02/2019 <20 NOT DETECTED  03/20/2018 <20 NOT DETECTED  12/22/2016 <20 NOT DETECTED   CD4 T Cell Abs (/uL)  Date Value  03/20/2018 510  12/22/2016 540  01/11/2016 610     Problem List Items Addressed This Visit      High   Human immunodeficiency virus (HIV) disease (HCC)    Her infection remains under excellent, long-term control.  She will continue Biktarvy and follow-up after lab work in 1 year.  She received her influenza vaccine today.  I told her that the usual incubation period for COVID is about 5 to 14 days.  The fact that she was socially distanced and both she and the other person were mask means that her risk of infection is relatively low.      Relevant Medications   bictegravir-emtricitabine-tenofovir AF (BIKTARVY) 50-200-25 MG TABS tablet   Other Relevant Orders   CBC   T-helper cell (CD4)- ([facility] clinic only)   Comprehensive metabolic panel   RPR   HIV-1 RNA quant-no reflex-bld        Cliffton Asters, MD [facility] for Infect[facility] Disease [facility] [phone] pager   [phone] cell 04/16/2019,  10:50 AM

## 2019-04-16 NOTE — Assessment & Plan Note (Signed)
Her infection remains under excellent, long-term control.  She will continue Biktarvy and follow-up after lab work in 1 year.  She received her influenza vaccine today.  I told her that the usual incubation period for COVID is about 5 to 14 days.  The fact that she was socially distanced and both she and the other person were mask means that her risk of infection is relatively low.

## 2019-06-02 ENCOUNTER — Other Ambulatory Visit: Payer: Self-pay | Admitting: Internal Medicine

## 2019-06-02 DIAGNOSIS — B20 Human immunodeficiency virus [HIV] disease: Secondary | ICD-10-CM

## 2019-06-05 ENCOUNTER — Other Ambulatory Visit: Payer: Self-pay

## 2019-06-05 DIAGNOSIS — Z20822 Contact with and (suspected) exposure to covid-19: Secondary | ICD-10-CM

## 2019-06-08 LAB — NOVEL CORONAVIRUS, NAA: SARS-CoV-2, NAA: NOT DETECTED

## 2019-07-04 DIAGNOSIS — Z1239 Encounter for other screening for malignant neoplasm of breast: Secondary | ICD-10-CM | POA: Diagnosis not present

## 2019-07-04 DIAGNOSIS — Z1211 Encounter for screening for malignant neoplasm of colon: Secondary | ICD-10-CM | POA: Diagnosis not present

## 2019-07-04 DIAGNOSIS — Z01419 Encounter for gynecological examination (general) (routine) without abnormal findings: Secondary | ICD-10-CM | POA: Diagnosis not present

## 2019-07-04 DIAGNOSIS — Z6828 Body mass index (BMI) 28.0-28.9, adult: Secondary | ICD-10-CM | POA: Diagnosis not present

## 2019-08-28 ENCOUNTER — Ambulatory Visit: Payer: BC Managed Care – PPO | Attending: Family

## 2019-08-28 DIAGNOSIS — Z23 Encounter for immunization: Secondary | ICD-10-CM

## 2019-08-28 NOTE — Progress Notes (Signed)
   Covid-19 Vaccination Clinic  Name:  Tyneka Scafidi    MRN: [id number] DOB: [birth year]  08/28/2019  Ms. Trant was observed post Covid-19 immunization for 15 minutes without incidence. She was provided with Vaccine Information Sheet and instruction to access the V-Safe system.   Ms. Newby was instructed to call 911 with any severe reactions post vaccine: Marland Kitchen Difficulty breathing  . Swelling of your face and throat  . A fast heartbeat  . A bad rash all over your body  . Dizziness and weakness    Immunizations Administered    Name Date Dose VIS Date Route   Moderna COVID-19 Vaccine 08/28/2019  1:20 PM 0.5 mL 06/17/2019 Intramuscular   Manufacturer: Moderna   Lot: [id number]   NDC: 49355-217-47

## 2019-09-30 ENCOUNTER — Ambulatory Visit: Payer: BC Managed Care – PPO | Attending: Family

## 2019-09-30 DIAGNOSIS — Z23 Encounter for immunization: Secondary | ICD-10-CM

## 2019-09-30 NOTE — Progress Notes (Addendum)
   Covid-19 Vaccination Clinic  Name:  Chloe Hancock    MRN: [id number] DOB: [birth year]  09/30/2019  [name] was observed post Covid-19 immunization for 15 minutes. Pt diaphoretic, No shortness of breath.  Vitals signs stable, monitored for additional 15 minutes.  She was provided with Vaccine Information Sheet and instruction to access the V-Safe system.   [name] was instructed to call 911 with any severe reactions post vaccine: Marland Kitchen Difficulty breathing  . Swelling of face and throat  . A fast heartbeat  . A bad rash all over body  . Dizziness and weakness   Immunizations Administered    Name Date Dose VIS Date Route   Moderna COVID-19 Vaccine 09/30/2019 12:39 PM 0.5 mL 06/17/2019 Intramuscular   Manufacturer: Moderna   Lot: [id number]   NDC: 83672-550-01

## 2019-09-30 NOTE — Progress Notes (Addendum)
Approximately seven minutes post vaccination, patient became weak and sweaty.  No SOB, rash, or itching.  Patients vitals signs taken bp 185/83, o2 sats 97% on room air.  Physical exam no wheezing no rash. Patient monitored for additional 15 minutes by Lesle Chris, MD.  Bp 156/81 pulse 76, o2 sats 100%.  Pt symptoms were resolved in 10 min. Sweating resolved, lungs clear, ambulated without difficulty.  Advised to call 911 with any further symptoms.  All questions answered, patient released.  Dr.Steven Daub made follow call to patient, patient asymptomatic.

## 2019-11-19 ENCOUNTER — Other Ambulatory Visit: Payer: Self-pay | Admitting: Obstetrics and Gynecology

## 2019-11-19 DIAGNOSIS — Z1231 Encounter for screening mammogram for malignant neoplasm of breast: Secondary | ICD-10-CM

## 2019-12-26 ENCOUNTER — Other Ambulatory Visit: Payer: Self-pay | Admitting: Internal Medicine

## 2019-12-26 DIAGNOSIS — B20 Human immunodeficiency virus [HIV] disease: Secondary | ICD-10-CM

## 2020-01-08 ENCOUNTER — Other Ambulatory Visit: Payer: Self-pay

## 2020-01-08 ENCOUNTER — Ambulatory Visit
Admission: RE | Admit: 2020-01-08 | Discharge: 2020-01-08 | Disposition: A | Discharge status: Home / Self Care | Payer: BC Managed Care – PPO | Source: Ambulatory Visit | Attending: Obstetrics and Gynecology | Admitting: Obstetrics and Gynecology

## 2020-01-08 DIAGNOSIS — Z1231 Encounter for screening mammogram for malignant neoplasm of breast: Secondary | ICD-10-CM

## 2020-04-21 ENCOUNTER — Other Ambulatory Visit: Payer: Self-pay

## 2020-04-21 DIAGNOSIS — Z113 Encounter for screening for infections with a predominantly sexual mode of transmission: Secondary | ICD-10-CM

## 2020-04-21 DIAGNOSIS — B20 Human immunodeficiency virus [HIV] disease: Secondary | ICD-10-CM

## 2020-04-21 DIAGNOSIS — Z79899 Other long term (current) drug therapy: Secondary | ICD-10-CM

## 2020-04-28 ENCOUNTER — Other Ambulatory Visit (HOSPITAL_COMMUNITY)
Admission: RE | Admit: 2020-04-28 | Discharge: 2020-04-28 | Disposition: A | Discharge status: Home / Self Care | Payer: BC Managed Care – PPO | Source: Ambulatory Visit | Attending: Internal Medicine | Admitting: Internal Medicine

## 2020-04-28 ENCOUNTER — Other Ambulatory Visit: Payer: Self-pay

## 2020-04-28 ENCOUNTER — Other Ambulatory Visit: Payer: BC Managed Care – PPO

## 2020-04-28 DIAGNOSIS — Z113 Encounter for screening for infections with a predominantly sexual mode of transmission: Secondary | ICD-10-CM | POA: Diagnosis not present

## 2020-04-28 DIAGNOSIS — B20 Human immunodeficiency virus [HIV] disease: Secondary | ICD-10-CM | POA: Insufficient documentation

## 2020-04-28 DIAGNOSIS — Z79899 Other long term (current) drug therapy: Secondary | ICD-10-CM

## 2020-04-29 LAB — URINE CYTOLOGY ANCILLARY ONLY
Chlamydia: NEGATIVE
Comment: NEGATIVE
Comment: NORMAL
Neisseria Gonorrhea: NEGATIVE

## 2020-04-29 LAB — T-HELPER CELL (CD4) - (RCID CLINIC ONLY)
CD4 % Helper T Cell: 25 % — ABNORMAL LOW (ref 33–65)
CD4 T Cell Abs: 452 /uL (ref 400–1790)

## 2020-04-30 LAB — LIPID PANEL
Cholesterol: 159 mg/dL (ref <200)
HDL: 56 mg/dL (ref >=50)
LDL Cholesterol (Calc): 87 mg/dL (calc)
Non-HDL Cholesterol (Calc): 103 mg/dL (calc) (ref <130)
Total CHOL/HDL Ratio: 2.8 (calc) (ref <5.0)
Triglycerides: 72 mg/dL (ref <150)

## 2020-04-30 LAB — CBC WITH DIFFERENTIAL/PLATELET
Absolute Monocytes: 456 cells/uL (ref 200–950)
Basophils Absolute: 29 cells/uL (ref 0–200)
Basophils Relative: 0.6 %
Eosinophils Absolute: 29 cells/uL (ref 15–500)
Eosinophils Relative: 0.6 %
HCT: 38.7 % (ref 35.0–45.0)
Hemoglobin: 13 g/dL (ref 11.7–15.5)
Lymphs Abs: 1891 cells/uL (ref 850–3900)
MCH: 29.7 pg (ref 27.0–33.0)
MCHC: 33.6 g/dL (ref 32.0–36.0)
MCV: 88.4 fL (ref 80.0–100.0)
MPV: 11.1 fL (ref 7.5–12.5)
Monocytes Relative: 9.5 %
Neutro Abs: 2395 cells/uL (ref 1500–7800)
Neutrophils Relative %: 49.9 %
Platelets: 194 10*3/uL (ref 140–400)
RBC: 4.38 10*6/uL (ref 3.80–5.10)
RDW: 13.7 % (ref 11.0–15.0)
Total Lymphocyte: 39.4 %
WBC: 4.8 10*3/uL (ref 3.8–10.8)

## 2020-04-30 LAB — COMPLETE METABOLIC PANEL WITH GFR
AG Ratio: 1.8 (calc) (ref 1.0–2.5)
ALT: 12 U/L (ref 6–29)
AST: 14 U/L (ref 10–35)
Albumin: 4.2 g/dL (ref 3.6–5.1)
Alkaline phosphatase (APISO): 53 U/L (ref 37–153)
BUN: 18 mg/dL (ref 7–25)
CO2: 25 mmol/L (ref 20–32)
Calcium: 9.1 mg/dL (ref 8.6–10.4)
Chloride: 108 mmol/L (ref 98–110)
Creat: 0.69 mg/dL (ref 0.60–0.93)
GFR, Est African American: 102 mL/min/{1.73_m2} (ref >=60)
GFR, Est Non African American: 88 mL/min/{1.73_m2} (ref >=60)
Globulin: 2.4 g/dL (calc) (ref 1.9–3.7)
Glucose, Bld: 135 mg/dL — ABNORMAL HIGH (ref 65–99)
Potassium: 4.2 mmol/L (ref 3.5–5.3)
Sodium: 140 mmol/L (ref 135–146)
Total Bilirubin: 0.3 mg/dL (ref 0.2–1.2)
Total Protein: 6.6 g/dL (ref 6.1–8.1)

## 2020-04-30 LAB — HIV-1 RNA QUANT-NO REFLEX-BLD
HIV 1 RNA Quant: <20 Copies/mL
HIV-1 RNA Quant, Log: <1.3 Log cps/mL

## 2020-04-30 LAB — RPR: RPR Ser Ql: NONREACTIVE

## 2020-05-12 ENCOUNTER — Ambulatory Visit (INDEPENDENT_AMBULATORY_CARE_PROVIDER_SITE_OTHER): Payer: BC Managed Care – PPO | Admitting: Internal Medicine

## 2020-05-12 ENCOUNTER — Other Ambulatory Visit: Payer: Self-pay

## 2020-05-12 ENCOUNTER — Encounter: Payer: Self-pay | Admitting: Internal Medicine

## 2020-05-12 DIAGNOSIS — B20 Human immunodeficiency virus [HIV] disease: Secondary | ICD-10-CM

## 2020-05-12 DIAGNOSIS — E119 Type 2 diabetes mellitus without complications: Secondary | ICD-10-CM

## 2020-05-12 MED ORDER — BIKTARVY 50-200-25 MG PO TABS: 1.0000 | ORAL_TABLET | Freq: Every day | ORAL | 11 refills | Status: DC

## 2020-05-12 NOTE — Progress Notes (Signed)
Patient Active Problem List   Diagnosis Date Noted  . Human immunodeficiency virus (HIV) disease (HCC) 07/28/2006    Priority: High  . Latent tuberculosis by blood test 06/28/2016  . GERD (gastroesophageal reflux disease) 02/15/2016  . Dyslipidemia 02/15/2016  . Irregular bleeding   . ALLERGIC RHINITIS 11/02/2009  . HYPERTENSION NEC 11/02/2009  . Diabetes mellitus (HCC) 07/28/2006  . HEADACHE 07/28/2006    Patient's Medications  New Prescriptions   No medications on file  Previous Medications   BLACK COHOSH-FLAXSEED-SOY (MENOPAUSE TRIO PO)    Take by mouth.   CALCIUM CARBONATE (OS-CAL) 600 MG TABS    Take 600 mg by mouth daily.   CHOLECALCIFEROL (VITAMIN D) 1000 UNITS TABLET    Take 1,000 Units by mouth daily.   FISH OIL-OMEGA-3 FATTY ACIDS 1000 MG CAPSULE    Take 1 g by mouth daily.     GLUCOSE BLOOD TEST STRIP    Use as instructed up to 2 x daily   MULTIPLE VITAMINS-MINERALS (MULTIVITAMIN & MINERAL PO)    Take by mouth.     ONDANSETRON (ZOFRAN) 4 MG TABLET    Take 1 tablet (4 mg total) by mouth every 8 (eight) hours as needed for nausea or vomiting.   QUINAPRIL-HYDROCHLOROTHIAZIDE (ACCURETIC) 20-25 MG PER TABLET    Take 0.5 tablets by mouth daily.     RA VITAMIN B-6 50 MG TABLET    take 1 tablet by mouth once daily   ROSUVASTATIN (CRESTOR) 10 MG TABLET    Take 10 mg by mouth daily.  Modified Medications   Modified Medication Previous Medication   BICTEGRAVIR-EMTRICITABINE-TENOFOVIR AF (BIKTARVY) 50-200-25 MG TABS TABLET BIKTARVY 50-200-25 MG TABS tablet      Take 1 tablet by mouth daily.    TAKE 1 TABLET BY MOUTH DAILY.  Discontinued Medications   No medications on file    Subjective: Rodina denies any problems obtaining, taking or tolerating her Biktarvy and never misses a single dose.  She is interested in learning more about long-acting, injectable antiretroviral medications.  She has received her first 2 Moderna Covid vaccines and is waiting to get her  booster.  She has already received her influenza vaccine.  She says she is working hard to control her blood sugar and would like to avoid having to take medication for her diabetes.  She has many questions about what diet she should follow.  Review of Systems: Review of Systems  Constitutional: Negative for fever and weight loss.  Respiratory: Negative for cough.   Cardiovascular: Negative for chest pain.    Past Medical History:  Diagnosis Date  . Headache(784.0)   . HIV positive (HCC) 1998  . Irregular bleeding   . Migraines     Social History   Tobacco Use  . Smoking status: Never Smoker  . Smokeless tobacco: Never Used  Substance Use Topics  . Alcohol use: Not on file  . Drug use: No    Family History  Problem Relation Age of Onset  . Diabetes Father     No Known Allergies  Health Maintenance  Topic Date Due  . HEMOGLOBIN A1C  Never done  . FOOT EXAM  Never done  . OPHTHALMOLOGY EXAM  Never done  . COLONOSCOPY  Never done  . DEXA SCAN  Never done  . INFLUENZA VACCINE  02/15/2020  . MAMMOGRAM  01/07/2022  . TETANUS/TDAP  05/20/2025  . COVID-19 Vaccine  Completed  . Hepatitis C Screening  Completed  . PNA vac Low Risk Adult  Completed    Objective:  Vitals:   05/12/20 1045  BP: (!) 144/82  Pulse: 83  SpO2: 98%  Weight: 169 lb (76.7 kg)   Body mass index is 28.12 kg/m.  Physical Exam Constitutional:      Comments: She is in good spirits.  Cardiovascular:     Rate and Rhythm: Normal rate and regular rhythm.     Heart sounds: No murmur heard.   Pulmonary:     Effort: Pulmonary effort is normal.     Breath sounds: Normal breath sounds.  Psychiatric:        Mood and Affect: Mood normal.     Lab Results Lab Results  Component Value Date   WBC 4.8 04/28/2020   HGB 13.0 04/28/2020   HCT 38.7 04/28/2020   MCV 88.4 04/28/2020   PLT 194 04/28/2020    Lab Results  Component Value Date   CREATININE 0.69 04/28/2020   BUN 18 04/28/2020   NA  140 04/28/2020   K 4.2 04/28/2020   CL 108 04/28/2020   CO2 25 04/28/2020    Lab Results  Component Value Date   ALT 12 04/28/2020   AST 14 04/28/2020   ALKPHOS 57 03/02/2017   BILITOT 0.3 04/28/2020    Lab Results  Component Value Date   CHOL 159 04/28/2020   HDL 56 04/28/2020   LDLCALC 87 04/28/2020   TRIG 72 04/28/2020   CHOLHDL 2.8 04/28/2020   Lab Results  Component Value Date   LABRPR NON-REACTIVE 04/28/2020   HIV 1 RNA Quant  Date Value  04/28/2020 <20 Copies/mL  04/02/2019 <20 NOT DETECTED copies/mL  03/20/2018 <20 NOT DETECTED copies/mL   CD4 T Cell Abs (/uL)  Date Value  04/28/2020 452  03/20/2018 510  12/22/2016 540     Problem List Items Addressed This Visit      High   Human immunodeficiency virus (HIV) disease (HCC)    Her infection remains under excellent, long-term control.  She will continue Biktarvy and follow-up in 1 year after lab work.  I told her that the injectable long-acting antiretroviral medication that was recently approved would actually be more difficult for her than taking 1 small Biktarvy tablet daily.      Relevant Medications   bictegravir-emtricitabine-tenofovir AF (BIKTARVY) 50-200-25 MG TABS tablet   Other Relevant Orders   CBC   T-helper cell (CD4)- ([facility] clinic only)   Comprehensive metabolic panel   Lipid panel   RPR   HIV-1 RNA quant-no reflex-bld     Unprioritized   Diabetes mellitus (HCC)    I made a referral for nutrition counseling.      Relevant Orders   Ambulatory referral to Nutrition and Diabetic Education        Cliffton Asters, MD [facility] for Infectious Disease [facility] [phone] pager   [phone] cell 05/12/2020, 11:20 AM

## 2020-05-12 NOTE — Assessment & Plan Note (Signed)
I made a referral for nutrition counseling.

## 2020-05-12 NOTE — Assessment & Plan Note (Signed)
Her infection remains under excellent, long-term control.  She will continue Biktarvy and follow-up in 1 year after lab work.  I told her that the injectable long-acting antiretroviral medication that was recently approved would actually be more difficult for her than taking 1 small Biktarvy tablet daily.

## 2020-05-17 ENCOUNTER — Encounter: Payer: BC Managed Care – PPO | Attending: Internal Medicine | Admitting: Skilled Nursing Facility1

## 2020-05-17 ENCOUNTER — Encounter: Payer: Self-pay | Admitting: Skilled Nursing Facility1

## 2020-05-17 ENCOUNTER — Other Ambulatory Visit: Payer: Self-pay

## 2020-05-17 DIAGNOSIS — E119 Type 2 diabetes mellitus without complications: Secondary | ICD-10-CM | POA: Insufficient documentation

## 2020-05-17 NOTE — Progress Notes (Signed)
Diabetes Self-Management Education  Visit Type: First/Initial 05/17/2020  [name], identified by name and date of birth, is a 69 y.o. female with a diagnosis of Diabetes: Type 2.   ASSESSMENT  There were no vitals taken for this visit. There is no height or weight on file to calculate BMI.  Pt states she had migraines about every week or month.  A1C as stated by pt 6.8. Pt states she checks her blood sugars 2 times a day fasting: 130-140; before lunch: 110 Pt states she feels she has a headache when she has a blood sugar at 145. Pt states sometimes she randomly checks 2 hours after she eats. Pt states she feels hunger. Pt states she cooks for the whole week. Pt states she tries to take the stairs when available.  Pt states she really wants tighter control.   Goals: 100 grams of carb with 25 being from fiber Any serving of starchy vegetable or fruit is 15 grams of carbohydrate: fruit the size of a baseball or 1/2 cup and starchy vegetables are typically 1/2 cup with about  Non starchy vegetables have about 5 grams of carbohydrate with about 2-3 grams of fiber  Nuts have about 9 grams of carbohydrate: 1/4 cup is the typical serving so allowing for 1-2 servings per day would be fine     Diabetes Self-Management Education - 05/17/20 1522      Visit Information   Visit Type First/Initial      Initial Visit   Diabetes Type Type 2    Are you currently following a meal plan? No    Are you taking your medications as prescribed? No      Health Coping   How would you rate your overall health? Fair      Psychosocial Assessment   Patient Belief/Attitude about Diabetes Motivated to manage diabetes    Self-care barriers None    Self-management support Family    Patient Concerns Nutrition/Meal planning    Special Needs None      Pre-Education Assessment   Patient understands the diabetes disease and treatment process. Needs Instruction    Patient understands incorporating  nutritional management into lifestyle. Needs Instruction    Patient undertands incorporating physical activity into lifestyle. Needs Instruction    Patient understands using medications safely. Needs Instruction    Patient understands monitoring blood glucose, interpreting and using results Needs Instruction    Patient understands prevention, detection, and treatment of acute complications. Needs Instruction    Patient understands prevention, detection, and treatment of chronic complications. Needs Instruction    Patient understands how to develop strategies to address psychosocial issues. Needs Instruction    Patient understands how to develop strategies to promote health/change behavior. Needs Instruction      Complications   Last HgB A1C per patient/outside source 6.8 %    How often do you check your blood sugar? 1-2 times/day    Have you had a dilated eye exam in the past 12 months? Yes    Have you had a dental exam in the past 12 months? Yes    Are you checking your feet? Yes    How many days per week are you checking your feet? 7      Dietary Intake   Breakfast warm water + lemon juice + glucerna    Snack (morning) cashews    Lunch chicken + olive oil + broccoli + spinach    Dinner fried chicken liver + riced cauliflower  Beverage(s) water, wine      Exercise   Exercise Type Light (walking / raking leaves)    How many days per week to you exercise? 3    How many minutes per day do you exercise? 30    Total minutes per week of exercise 90      Patient Education   Previous Diabetes Education No      Individualized Goals (developed by patient)   Physical Activity Exercise 5-7 days per week;30 minutes per day    Monitoring  test my blood glucose as discussed;test blood glucose pre and post meals as discussed      Post-Education Assessment   Patient understands the diabetes disease and treatment process. Demonstrates understanding / competency    Patient understands  incorporating nutritional management into lifestyle. Demonstrates understanding / competency    Patient undertands incorporating physical activity into lifestyle. Demonstrates understanding / competency    Patient understands using medications safely. Demonstrates understanding / competency    Patient understands monitoring blood glucose, interpreting and using results Demonstrates understanding / competency    Patient understands prevention, detection, and treatment of acute complications. Demonstrates understanding / competency    Patient understands prevention, detection, and treatment of chronic complications. Demonstrates understanding / competency    Patient understands how to develop strategies to address psychosocial issues. Demonstrates understanding / competency    Patient understands how to develop strategies to promote health/change behavior. Demonstrates understanding / competency      Outcomes   Expected Outcomes Demonstrated interest in learning. Expect positive outcomes    Future DMSE 4-6 wks    Program Status Completed           Individualized Plan for Diabetes Self-Management Training:   Learning Objective:  Patient will have a greater understanding of diabetes self-management. Patient education plan is to attend individual and/or group sessions per assessed needs and concerns.   Plan:   There are no Patient Instructions on file for this visit.  Expected Outcomes:  Demonstrated interest in learning. Expect positive outcomes  Education material provided: ADA - How to Thrive: A Guide for Your Journey with Diabetes, Meal plan card and My Plate  If problems or questions, patient to contact team via:  Phone  Future DSME appointment: 4-6 wks

## 2020-05-24 ENCOUNTER — Ambulatory Visit: Payer: BC Managed Care – PPO | Attending: Family

## 2020-05-24 DIAGNOSIS — Z23 Encounter for immunization: Secondary | ICD-10-CM

## 2020-06-15 ENCOUNTER — Ambulatory Visit: Payer: BC Managed Care – PPO | Admitting: Skilled Nursing Facility1

## 2020-07-22 ENCOUNTER — Other Ambulatory Visit: Payer: Self-pay | Admitting: Internal Medicine

## 2020-07-22 DIAGNOSIS — B20 Human immunodeficiency virus [HIV] disease: Secondary | ICD-10-CM

## 2020-08-17 NOTE — Progress Notes (Signed)
   Covid-19 Vaccination Clinic  Name:  Chloe Hancock    MRN: [id number] DOB: 1951-05-22  08/17/2020  [name] was observed post Covid-19 immunization for 15 minutes without incident. She was provided with Vaccine Information Sheet and instruction to access the V-Safe system.   [name] was instructed to call 911 with any severe reactions post vaccine: Marland Kitchen Difficulty breathing  . Swelling of face and throat  . A fast heartbeat  . A bad rash all over body  . Dizziness and weakness   Immunizations Administered    Name Date Dose VIS Date Route   Moderna Covid-19 Booster Vaccine 05/24/2020 11:00 AM 0.25 mL 05/05/2020 Intramuscular   Manufacturer: Moderna   Lot: [id number]   NDC: 95396-728-97

## 2020-12-14 ENCOUNTER — Other Ambulatory Visit: Payer: Self-pay | Admitting: Nurse Practitioner

## 2020-12-14 DIAGNOSIS — Z1231 Encounter for screening mammogram for malignant neoplasm of breast: Secondary | ICD-10-CM

## 2020-12-31 ENCOUNTER — Other Ambulatory Visit: Payer: Self-pay | Admitting: Nurse Practitioner

## 2020-12-31 DIAGNOSIS — M8580 Other specified disorders of bone density and structure, unspecified site: Secondary | ICD-10-CM

## 2021-02-07 ENCOUNTER — Other Ambulatory Visit: Payer: Self-pay

## 2021-02-07 ENCOUNTER — Ambulatory Visit
Admission: RE | Admit: 2021-02-07 | Discharge: 2021-02-07 | Disposition: A | Discharge status: Home / Self Care | Payer: Medicare Other | Source: Ambulatory Visit | Attending: Nurse Practitioner | Admitting: Nurse Practitioner

## 2021-02-07 DIAGNOSIS — Z1231 Encounter for screening mammogram for malignant neoplasm of breast: Secondary | ICD-10-CM

## 2021-02-28 ENCOUNTER — Other Ambulatory Visit: Payer: Self-pay | Admitting: Internal Medicine

## 2021-02-28 DIAGNOSIS — B20 Human immunodeficiency virus [HIV] disease: Secondary | ICD-10-CM

## 2021-04-28 ENCOUNTER — Other Ambulatory Visit: Payer: Medicare Other

## 2021-04-28 ENCOUNTER — Other Ambulatory Visit: Payer: Self-pay

## 2021-04-28 DIAGNOSIS — B20 Human immunodeficiency virus [HIV] disease: Secondary | ICD-10-CM

## 2021-04-29 LAB — T-HELPER CELL (CD4) - (RCID CLINIC ONLY)
CD4 % Helper T Cell: 26 % — ABNORMAL LOW (ref 33–65)
CD4 T Cell Abs: 421 /uL (ref 400–1790)

## 2021-04-30 LAB — COMPREHENSIVE METABOLIC PANEL
AG Ratio: 1.6 (calc) (ref 1.0–2.5)
ALT: 11 U/L (ref 6–29)
AST: 13 U/L (ref 10–35)
Albumin: 4.2 g/dL (ref 3.6–5.1)
Alkaline phosphatase (APISO): 60 U/L (ref 37–153)
BUN: 14 mg/dL (ref 7–25)
CO2: 24 mmol/L (ref 20–32)
Calcium: 9 mg/dL (ref 8.6–10.4)
Chloride: 111 mmol/L — ABNORMAL HIGH (ref 98–110)
Creat: 0.81 mg/dL (ref 0.60–1.00)
Globulin: 2.7 g/dL (calc) (ref 1.9–3.7)
Glucose, Bld: 147 mg/dL — ABNORMAL HIGH (ref 65–99)
Potassium: 4 mmol/L (ref 3.5–5.3)
Sodium: 139 mmol/L (ref 135–146)
Total Bilirubin: 0.3 mg/dL (ref 0.2–1.2)
Total Protein: 6.9 g/dL (ref 6.1–8.1)

## 2021-04-30 LAB — CBC
HCT: 40.5 % (ref 35.0–45.0)
Hemoglobin: 13.7 g/dL (ref 11.7–15.5)
MCH: 30.1 pg (ref 27.0–33.0)
MCHC: 33.8 g/dL (ref 32.0–36.0)
MCV: 89 fL (ref 80.0–100.0)
MPV: 10.9 fL (ref 7.5–12.5)
Platelets: 201 10*3/uL (ref 140–400)
RBC: 4.55 10*6/uL (ref 3.80–5.10)
RDW: 13.4 % (ref 11.0–15.0)
WBC: 4 10*3/uL (ref 3.8–10.8)

## 2021-04-30 LAB — ABN TEST REFUSAL

## 2021-04-30 LAB — HIV-1 RNA QUANT-NO REFLEX-BLD
HIV 1 RNA Quant: NOT DETECTED Copies/mL
HIV-1 RNA Quant, Log: NOT DETECTED Log cps/mL

## 2021-05-12 ENCOUNTER — Ambulatory Visit (INDEPENDENT_AMBULATORY_CARE_PROVIDER_SITE_OTHER): Payer: BC Managed Care – PPO

## 2021-05-12 ENCOUNTER — Encounter: Payer: Self-pay | Admitting: Internal Medicine

## 2021-05-12 ENCOUNTER — Other Ambulatory Visit: Payer: Self-pay

## 2021-05-12 ENCOUNTER — Ambulatory Visit (INDEPENDENT_AMBULATORY_CARE_PROVIDER_SITE_OTHER): Payer: BC Managed Care – PPO | Admitting: Internal Medicine

## 2021-05-12 VITALS — BP 123/78 | HR 76 | Temp 97.8°F | Resp 16 | Ht 65.0 in | Wt 168.0 lb

## 2021-05-12 DIAGNOSIS — E119 Type 2 diabetes mellitus without complications: Secondary | ICD-10-CM

## 2021-05-12 DIAGNOSIS — F418 Other specified anxiety disorders: Secondary | ICD-10-CM

## 2021-05-12 DIAGNOSIS — Z23 Encounter for immunization: Secondary | ICD-10-CM

## 2021-05-12 DIAGNOSIS — B20 Human immunodeficiency virus [HIV] disease: Secondary | ICD-10-CM

## 2021-05-12 MED ORDER — BIKTARVY 50-200-25 MG PO TABS: 1.0000 | ORAL_TABLET | Freq: Every day | ORAL | 11 refills | Status: DC

## 2021-05-12 NOTE — Progress Notes (Addendum)
   Covid-19 Vaccination Clinic  Name:  Chloe Hancock    MRN: [id number] DOB: 1950-08-09  05/25/2021  Chloe Hancock was observed post Covid-19 immunization for 15 minutes without incident. She was provided with Vaccine Information Sheet and instruction to access the V-Safe system.   Chloe Hancock was instructed to call 911 with any severe reactions post vaccine: Difficulty breathing  Swelling of face and throat  A fast heartbeat  A bad rash all over body  Dizziness and weakness   Immunizations Administered     Name Date Dose VIS Date Route   Pfizer Covid-19 Vaccine Bivalent Booster 05/12/2021 10:47 AM 0.3 mL 03/16/2021 Intramuscular   Manufacturer: [employer], [employer]   Lot: [id number]   NDC: [phone]       Sarahjane Matherly Lesli Albee, CMA

## 2021-05-12 NOTE — Assessment & Plan Note (Signed)
She has mild situational depression with anxiety.  I encouraged her to limit intake of "news" and to try to get regular exercise.

## 2021-05-12 NOTE — Assessment & Plan Note (Signed)
Her infection remains under excellent, long-term control.  She will continue Biktarvy and follow-up after lab work in 1 year.  She received her annual influenza vaccine and a COVID booster vaccine here today.

## 2021-05-12 NOTE — Progress Notes (Signed)
Patient Active Problem List   Diagnosis Date Noted   Human immunodeficiency virus (HIV) disease (HCC) 07/28/2006    Priority: 1.   Depression with anxiety 05/12/2021   Latent tuberculosis by blood test 06/28/2016   GERD (gastroesophageal reflux disease) 02/15/2016   Dyslipidemia 02/15/2016   Irregular bleeding    ALLERGIC RHINITIS 11/02/2009   HYPERTENSION NEC 11/02/2009   Diabetes mellitus (HCC) 07/28/2006   HEADACHE 07/28/2006    Patient's Medications  New Prescriptions   No medications on file  Previous Medications   BLACK COHOSH-FLAXSEED-SOY (MENOPAUSE TRIO PO)    Take by mouth.   CALCIUM CARBONATE (OS-CAL) 600 MG TABS    Take 600 mg by mouth daily.   CHOLECALCIFEROL (VITAMIN D) 1000 UNITS TABLET    Take 1,000 Units by mouth daily.   FISH OIL-OMEGA-3 FATTY ACIDS 1000 MG CAPSULE    Take 1 g by mouth daily.     GLUCOSE BLOOD TEST STRIP    Use as instructed up to 2 x daily   MULTIPLE VITAMINS-MINERALS (MULTIVITAMIN & MINERAL PO)    Take by mouth.     ONDANSETRON (ZOFRAN) 4 MG TABLET    Take 1 tablet (4 mg total) by mouth every 8 (eight) hours as needed for nausea or vomiting.   QUINAPRIL (ACCUPRIL) 10 MG TABLET    Take 10 mg by mouth daily.   QUINAPRIL (ACCUPRIL) 20 MG TABLET    Take 20 mg by mouth daily.   QUINAPRIL-HYDROCHLOROTHIAZIDE (ACCURETIC) 20-25 MG PER TABLET    Take 0.5 tablets by mouth daily.     RA VITAMIN B-6 50 MG TABLET    take 1 tablet by mouth once daily   ROSUVASTATIN (CRESTOR) 10 MG TABLET    Take 10 mg by mouth daily.   TOPIRAMATE (TOPAMAX) 50 MG TABLET    Take 50 mg by mouth daily.  Modified Medications   Modified Medication Previous Medication   BICTEGRAVIR-EMTRICITABINE-TENOFOVIR AF (BIKTARVY) 50-200-25 MG TABS TABLET BIKTARVY 50-200-25 MG TABS tablet      Take 1 tablet by mouth daily.    TAKE 1 TABLET BY MOUTH DAILY.  Discontinued Medications   No medications on file    Subjective: Chloe Hancock is in for her routine HIV follow-up visit.  She  denies any problems obtaining, taking or tolerating her Biktarvy and, as usual, never misses doses.  She says that she has been feeling slightly anxious and down given everything that is going on in the world today.  She has not had her annual influenza vaccine or an updated COVID booster vaccine yet.  She is not getting any regular exercise.  Review of Systems: Review of Systems  Constitutional:  Negative for fever and weight loss.  Psychiatric/Behavioral:  Positive for depression. The patient is nervous/anxious.    Past Medical History:  Diagnosis Date   Diabetes mellitus without complication (HCC)    Headache(784.0)    HIV positive (HCC) 1998   Irregular bleeding    Migraines     Social History   Tobacco Use   Smoking status: Never   Smokeless tobacco: Never  Substance Use Topics   Drug use: No    Family History  Problem Relation Age of Onset   Diabetes Father     No Known Allergies  Health Maintenance  Topic Date Due   HEMOGLOBIN A1C  Never done   FOOT EXAM  Never done   OPHTHALMOLOGY EXAM  Never done   Zoster Vaccines-  Shingrix (1 of 2) Never done   COLONOSCOPY (Pts 45-11yrs Insurance coverage will need to be confirmed)  Never done   DEXA SCAN  Never done   COVID-19 Vaccine (3 - Moderna risk series) 06/21/2020   INFLUENZA VACCINE  02/14/2021   MAMMOGRAM  02/08/2023   TETANUS/TDAP  05/20/2025   Pneumonia Vaccine 78+ Years old  Completed   Hepatitis C Screening  Completed   HPV VACCINES  Aged Out    Objective:  Vitals:   05/12/21 0957  BP: 123/78  Pulse: 76  Resp: 16  Temp: 97.8 F (36.6 C)  TempSrc: Temporal  SpO2: 99%  Weight: 168 lb (76.2 kg)  Height: 5\' 5"  (1.651 m)   Body mass index is 27.96 kg/m.  Physical Exam Constitutional:      Comments: She is quiet and very pleasant as usual.  Cardiovascular:     Rate and Rhythm: Normal rate.  Pulmonary:     Effort: Pulmonary effort is normal.  Skin:    Findings: No rash.  Psychiatric:         Mood and Affect: Mood normal.    Lab Results Lab Results  Component Value Date   WBC 4.0 04/28/2021   HGB 13.7 04/28/2021   HCT 40.5 04/28/2021   MCV 89.0 04/28/2021   PLT 201 04/28/2021    Lab Results  Component Value Date   CREATININE 0.81 04/28/2021   BUN 14 04/28/2021   NA 139 04/28/2021   K 4.0 04/28/2021   CL 111 (H) 04/28/2021   CO2 24 04/28/2021    Lab Results  Component Value Date   ALT 11 04/28/2021   AST 13 04/28/2021   ALKPHOS 57 03/02/2017   BILITOT 0.3 04/28/2021    Lab Results  Component Value Date   CHOL 159 04/28/2020   HDL 56 04/28/2020   LDLCALC 87 04/28/2020   TRIG 72 04/28/2020   CHOLHDL 2.8 04/28/2020   Lab Results  Component Value Date   LABRPR NON-REACTIVE 04/28/2020   HIV 1 RNA Quant  Date Value  04/28/2021 Not Detected Copies/mL  04/28/2020 <20 Copies/mL  04/02/2019 <20 NOT DETECTED copies/mL   CD4 T Cell Abs (/uL)  Date Value  04/28/2021 421  04/28/2020 452  03/20/2018 510     Problem List Items Addressed This Visit       1.   Human immunodeficiency virus (HIV) disease (HCC)    Her infection remains under excellent, long-term control.  She will continue Biktarvy and follow-up after lab work in 1 year.  She received her annual influenza vaccine and a COVID booster vaccine here today.      Relevant Medications   bictegravir-emtricitabine-tenofovir AF (BIKTARVY) 50-200-25 MG TABS tablet   Other Relevant Orders   CBC   T-helper cell (CD4)- ([facility] clinic only)   Comprehensive metabolic panel   HIV-1 RNA quant-no reflex-bld   RPR     Unprioritized   Diabetes mellitus (HCC)    I encouraged her to do her best to get some regular exercise.      Relevant Medications   quinapril (ACCUPRIL) 20 MG tablet   quinapril (ACCUPRIL) 10 MG tablet   Depression with anxiety    She has mild situational depression with anxiety.  I encouraged her to limit intake of "news" and to try to get regular exercise.         05/20/2018,  MD [facility] [phone] pager   [phone] cell  05/12/2021, 10:21 AM

## 2021-05-12 NOTE — Assessment & Plan Note (Signed)
I encouraged her to do her best to get some regular exercise.

## 2021-06-28 ENCOUNTER — Ambulatory Visit
Admission: RE | Admit: 2021-06-28 | Discharge: 2021-06-28 | Disposition: A | Discharge status: Home / Self Care | Payer: BC Managed Care – PPO | Source: Ambulatory Visit | Attending: Nurse Practitioner | Admitting: Nurse Practitioner

## 2021-06-28 ENCOUNTER — Other Ambulatory Visit: Payer: Self-pay

## 2021-06-28 DIAGNOSIS — M8580 Other specified disorders of bone density and structure, unspecified site: Secondary | ICD-10-CM

## 2021-08-26 ENCOUNTER — Ambulatory Visit
Admission: RE | Admit: 2021-08-26 | Discharge: 2021-08-26 | Disposition: A | Discharge status: Home / Self Care | Payer: BC Managed Care – PPO | Source: Ambulatory Visit | Attending: Family Medicine | Admitting: Family Medicine

## 2021-08-26 ENCOUNTER — Other Ambulatory Visit: Payer: Self-pay

## 2021-08-26 ENCOUNTER — Other Ambulatory Visit: Payer: Self-pay | Admitting: Family Medicine

## 2021-08-26 DIAGNOSIS — M25512 Pain in left shoulder: Secondary | ICD-10-CM

## 2021-09-05 DIAGNOSIS — M25512 Pain in left shoulder: Secondary | ICD-10-CM | POA: Diagnosis not present

## 2021-09-09 DIAGNOSIS — M5412 Radiculopathy, cervical region: Secondary | ICD-10-CM | POA: Diagnosis not present

## 2021-09-09 DIAGNOSIS — R293 Abnormal posture: Secondary | ICD-10-CM | POA: Diagnosis not present

## 2021-09-09 DIAGNOSIS — M25512 Pain in left shoulder: Secondary | ICD-10-CM | POA: Diagnosis not present

## 2021-09-09 DIAGNOSIS — M6258 Muscle wasting and atrophy, not elsewhere classified, other site: Secondary | ICD-10-CM | POA: Diagnosis not present

## 2021-09-12 DIAGNOSIS — M6258 Muscle wasting and atrophy, not elsewhere classified, other site: Secondary | ICD-10-CM | POA: Diagnosis not present

## 2021-09-12 DIAGNOSIS — M5412 Radiculopathy, cervical region: Secondary | ICD-10-CM | POA: Diagnosis not present

## 2021-09-12 DIAGNOSIS — R293 Abnormal posture: Secondary | ICD-10-CM | POA: Diagnosis not present

## 2021-09-12 DIAGNOSIS — M25512 Pain in left shoulder: Secondary | ICD-10-CM | POA: Diagnosis not present

## 2021-09-19 DIAGNOSIS — M25512 Pain in left shoulder: Secondary | ICD-10-CM | POA: Diagnosis not present

## 2021-09-19 DIAGNOSIS — M6258 Muscle wasting and atrophy, not elsewhere classified, other site: Secondary | ICD-10-CM | POA: Diagnosis not present

## 2021-09-19 DIAGNOSIS — R293 Abnormal posture: Secondary | ICD-10-CM | POA: Diagnosis not present

## 2021-09-19 DIAGNOSIS — M5412 Radiculopathy, cervical region: Secondary | ICD-10-CM | POA: Diagnosis not present

## 2021-09-21 DIAGNOSIS — R293 Abnormal posture: Secondary | ICD-10-CM | POA: Diagnosis not present

## 2021-09-21 DIAGNOSIS — M6258 Muscle wasting and atrophy, not elsewhere classified, other site: Secondary | ICD-10-CM | POA: Diagnosis not present

## 2021-09-21 DIAGNOSIS — M5412 Radiculopathy, cervical region: Secondary | ICD-10-CM | POA: Diagnosis not present

## 2021-09-21 DIAGNOSIS — M25512 Pain in left shoulder: Secondary | ICD-10-CM | POA: Diagnosis not present

## 2021-09-23 DIAGNOSIS — R293 Abnormal posture: Secondary | ICD-10-CM | POA: Diagnosis not present

## 2021-09-23 DIAGNOSIS — M5412 Radiculopathy, cervical region: Secondary | ICD-10-CM | POA: Diagnosis not present

## 2021-09-23 DIAGNOSIS — M25512 Pain in left shoulder: Secondary | ICD-10-CM | POA: Diagnosis not present

## 2021-09-23 DIAGNOSIS — M6258 Muscle wasting and atrophy, not elsewhere classified, other site: Secondary | ICD-10-CM | POA: Diagnosis not present

## 2021-09-26 DIAGNOSIS — M5412 Radiculopathy, cervical region: Secondary | ICD-10-CM | POA: Diagnosis not present

## 2021-09-26 DIAGNOSIS — M25512 Pain in left shoulder: Secondary | ICD-10-CM | POA: Diagnosis not present

## 2021-09-26 DIAGNOSIS — M6258 Muscle wasting and atrophy, not elsewhere classified, other site: Secondary | ICD-10-CM | POA: Diagnosis not present

## 2021-09-26 DIAGNOSIS — R293 Abnormal posture: Secondary | ICD-10-CM | POA: Diagnosis not present

## 2021-10-06 DIAGNOSIS — M5412 Radiculopathy, cervical region: Secondary | ICD-10-CM | POA: Diagnosis not present

## 2021-10-06 DIAGNOSIS — R293 Abnormal posture: Secondary | ICD-10-CM | POA: Diagnosis not present

## 2021-10-06 DIAGNOSIS — M25512 Pain in left shoulder: Secondary | ICD-10-CM | POA: Diagnosis not present

## 2021-10-06 DIAGNOSIS — M6258 Muscle wasting and atrophy, not elsewhere classified, other site: Secondary | ICD-10-CM | POA: Diagnosis not present

## 2022-02-09 ENCOUNTER — Other Ambulatory Visit: Payer: Self-pay | Admitting: Nurse Practitioner

## 2022-02-09 ENCOUNTER — Ambulatory Visit
Admission: RE | Admit: 2022-02-09 | Discharge: 2022-02-09 | Disposition: A | Discharge status: Home / Self Care | Payer: BC Managed Care – PPO | Source: Ambulatory Visit | Attending: Nurse Practitioner | Admitting: Nurse Practitioner

## 2022-02-09 DIAGNOSIS — Z1231 Encounter for screening mammogram for malignant neoplasm of breast: Secondary | ICD-10-CM

## 2022-05-01 ENCOUNTER — Other Ambulatory Visit: Payer: BC Managed Care – PPO

## 2022-05-01 ENCOUNTER — Other Ambulatory Visit: Payer: Self-pay

## 2022-05-01 ENCOUNTER — Other Ambulatory Visit: Payer: Self-pay | Admitting: Internal Medicine

## 2022-05-01 DIAGNOSIS — B20 Human immunodeficiency virus [HIV] disease: Secondary | ICD-10-CM

## 2022-05-02 LAB — T-HELPER CELL (CD4) - (RCID CLINIC ONLY)
CD4 % Helper T Cell: 27 % — ABNORMAL LOW (ref 33–65)
CD4 T Cell Abs: 510 /uL (ref 400–1790)

## 2022-05-04 LAB — CBC
HCT: 40.4 % (ref 35.0–45.0)
Hemoglobin: 13.3 g/dL (ref 11.7–15.5)
MCH: 30.3 pg (ref 27.0–33.0)
MCHC: 32.9 g/dL (ref 32.0–36.0)
MCV: 92 fL (ref 80.0–100.0)
MPV: 11.5 fL (ref 7.5–12.5)
Platelets: 203 10*3/uL (ref 140–400)
RBC: 4.39 10*6/uL (ref 3.80–5.10)
RDW: 14.3 % (ref 11.0–15.0)
WBC: 5.6 10*3/uL (ref 3.8–10.8)

## 2022-05-04 LAB — COMPREHENSIVE METABOLIC PANEL
AG Ratio: 1.6 (calc) (ref 1.0–2.5)
ALT: 17 U/L (ref 6–29)
AST: 17 U/L (ref 10–35)
Albumin: 4.4 g/dL (ref 3.6–5.1)
Alkaline phosphatase (APISO): 58 U/L (ref 37–153)
BUN: 23 mg/dL (ref 7–25)
CO2: 24 mmol/L (ref 20–32)
Calcium: 9.5 mg/dL (ref 8.6–10.4)
Chloride: 108 mmol/L (ref 98–110)
Creat: 0.63 mg/dL (ref 0.60–1.00)
Globulin: 2.7 g/dL (calc) (ref 1.9–3.7)
Glucose, Bld: 133 mg/dL — ABNORMAL HIGH (ref 65–99)
Potassium: 4.3 mmol/L (ref 3.5–5.3)
Sodium: 142 mmol/L (ref 135–146)
Total Bilirubin: 0.2 mg/dL (ref 0.2–1.2)
Total Protein: 7.1 g/dL (ref 6.1–8.1)

## 2022-05-04 LAB — RPR: RPR Ser Ql: NONREACTIVE

## 2022-05-04 LAB — HIV-1 RNA QUANT-NO REFLEX-BLD
HIV 1 RNA Quant: NOT DETECTED Copies/mL
HIV-1 RNA Quant, Log: NOT DETECTED Log cps/mL

## 2022-05-16 ENCOUNTER — Encounter: Payer: Self-pay | Admitting: Internal Medicine

## 2022-05-16 ENCOUNTER — Ambulatory Visit (INDEPENDENT_AMBULATORY_CARE_PROVIDER_SITE_OTHER): Payer: BC Managed Care – PPO | Admitting: Internal Medicine

## 2022-05-16 ENCOUNTER — Ambulatory Visit (INDEPENDENT_AMBULATORY_CARE_PROVIDER_SITE_OTHER): Payer: BC Managed Care – PPO

## 2022-05-16 ENCOUNTER — Other Ambulatory Visit: Payer: Self-pay

## 2022-05-16 VITALS — BP 133/82 | HR 82 | Temp 98.2°F | Ht 65.0 in | Wt 161.0 lb

## 2022-05-16 DIAGNOSIS — Z23 Encounter for immunization: Secondary | ICD-10-CM | POA: Diagnosis not present

## 2022-05-16 DIAGNOSIS — B20 Human immunodeficiency virus [HIV] disease: Secondary | ICD-10-CM

## 2022-05-16 MED ORDER — BIKTARVY 50-200-25 MG PO TABS: 1.0000 | ORAL_TABLET | Freq: Every day | ORAL | 11 refills | Status: DC

## 2022-05-16 NOTE — Assessment & Plan Note (Signed)
Her infection remains under excellent, long-term control.  She will continue Biktarvy and follow-up after lab work in 1 year.  She received her annual influenza vaccine and an updated COVID vaccine here today.

## 2022-05-16 NOTE — Progress Notes (Signed)
Patient Active Problem List   Diagnosis Date Noted   Human immunodeficiency virus (HIV) disease ([place]) 07/28/2006    Priority: High   Depression with anxiety 05/12/2021   Latent tuberculosis by blood test 06/28/2016   GERD (gastroesophageal reflux disease) 02/15/2016   Dyslipidemia 02/15/2016   Irregular bleeding    ALLERGIC RHINITIS 11/02/2009   HYPERTENSION NEC 11/02/2009   Diabetes mellitus ([place]) 07/28/2006   HEADACHE 07/28/2006    Patient's Medications  New Prescriptions   No medications on file  Previous Medications   BLACK COHOSH-FLAXSEED-SOY (MENOPAUSE TRIO PO)    Take by mouth.   CALCIUM CARBONATE (OS-CAL) 600 MG TABS    Take 600 mg by mouth daily.   CHOLECALCIFEROL (VITAMIN D) 1000 UNITS TABLET    Take 1,000 Units by mouth daily.   FISH OIL-OMEGA-3 FATTY ACIDS 1000 MG CAPSULE    Take 1 g by mouth daily.   GLUCOSE BLOOD TEST STRIP    Use as instructed up to 2 x daily   MULTIPLE VITAMINS-MINERALS (MULTIVITAMIN & MINERAL PO)    Take by mouth.     RA VITAMIN B-6 50 MG TABLET    take 1 tablet by mouth once daily   ROSUVASTATIN (CRESTOR) 10 MG TABLET    Take 10 mg by mouth daily.  Modified Medications   Modified Medication Previous Medication   BICTEGRAVIR-EMTRICITABINE-TENOFOVIR AF (BIKTARVY) 50-200-25 MG TABS TABLET BIKTARVY 50-200-25 MG TABS tablet      Take 1 tablet by mouth daily.    TAKE 1 TABLET BY MOUTH EVERY DAY  Discontinued Medications   ONDANSETRON (ZOFRAN) 4 MG TABLET    Take 1 tablet (4 mg total) by mouth every 8 (eight) hours as needed for nausea or vomiting.   QUINAPRIL (ACCUPRIL) 10 MG TABLET    Take 10 mg by mouth daily.   QUINAPRIL (ACCUPRIL) 20 MG TABLET    Take 20 mg by mouth daily.   QUINAPRIL-HYDROCHLOROTHIAZIDE (ACCURETIC) 20-25 MG PER TABLET    Take 0.5 tablets by mouth daily.   TOPIRAMATE (TOPAMAX) 50 MG TABLET    Take 50 mg by mouth daily.    Subjective: Chloe Hancock is in for her routine HIV follow-up visit.  She has not had any problems  obtaining, taking or tolerating her Biktarvy and has not missed any doses.  She is feeling well.  Review of Systems: Review of Systems  Constitutional:  Negative for chills and weight loss.    Past Medical History:  Diagnosis Date   Diabetes mellitus without complication (HCC)    PRFFMBWG(665.9)    HIV positive ([place]) 1998   Irregular bleeding    Migraines     Social History   Tobacco Use   Smoking status: Never   Smokeless tobacco: Never  Substance Use Topics   Drug use: No    Family History  Problem Relation Age of Onset   Diabetes Father     No Known Allergies  Health Maintenance  Topic Date Due   HEMOGLOBIN A1C  Never done   Medicare Annual Wellness (AWV)  Never done   FOOT EXAM  Never done   OPHTHALMOLOGY EXAM  Never done   Zoster Vaccines- Shingrix (1 of 2) Never done   COLONOSCOPY (Pts 45-49yrs Insurance coverage will need to be confirmed)  Never done   Diabetic kidney evaluation - Urine ACR  01/12/2016   COVID-19 Vaccine (4 - Mixed Product risk series) 07/07/2021   INFLUENZA VACCINE  02/14/2022  Diabetic kidney evaluation - GFR measurement  05/02/2023   MAMMOGRAM  02/10/2024   TETANUS/TDAP  05/20/2025   Pneumonia Vaccine 35+ Years old  Completed   DEXA SCAN  Completed   Hepatitis C Screening  Completed   HPV VACCINES  Aged Out    Objective:  Vitals:   05/16/22 1407  BP: 133/82  Pulse: 82  Temp: 98.2 F (36.8 C)  TempSrc: Oral  Weight: 161 lb (73 kg)  Height: 5\' 5"  (1.651 m)   Body mass index is 26.79 kg/m.  Physical Exam Constitutional:      Comments: Her spirits are good.  Cardiovascular:     Rate and Rhythm: Normal rate.  Pulmonary:     Effort: Pulmonary effort is normal.  Psychiatric:        Mood and Affect: Mood normal.     Lab Results Lab Results  Component Value Date   WBC 5.6 05/01/2022   HGB 13.3 05/01/2022   HCT 40.4 05/01/2022   MCV 92.0 05/01/2022   PLT 203 05/01/2022    Lab Results  Component Value Date    CREATININE 0.63 05/01/2022   BUN 23 05/01/2022   NA 142 05/01/2022   K 4.3 05/01/2022   CL 108 05/01/2022   CO2 24 05/01/2022    Lab Results  Component Value Date   ALT 17 05/01/2022   AST 17 05/01/2022   ALKPHOS 57 03/02/2017   BILITOT 0.2 05/01/2022    Lab Results  Component Value Date   CHOL 159 04/28/2020   HDL 56 04/28/2020   LDLCALC 87 04/28/2020   TRIG 72 04/28/2020   CHOLHDL 2.8 04/28/2020   Lab Results  Component Value Date   LABRPR NON-REACTIVE 05/01/2022   HIV 1 RNA Quant (Copies/mL)  Date Value  05/01/2022 Not Detected  04/28/2021 Not Detected  04/28/2020 <20   CD4 T Cell Abs (/uL)  Date Value  05/01/2022 510  04/28/2021 421  04/28/2020 452     Problem List Items Addressed This Visit       High   Human immunodeficiency virus (HIV) disease (HCC)    Her infection remains under excellent, long-term control.  She will continue Biktarvy and follow-up after lab work in 1 year.  She received her annual influenza vaccine and an updated COVID vaccine here today.      Relevant Medications   bictegravir-emtricitabine-tenofovir AF (BIKTARVY) 50-200-25 MG TABS tablet   Other Relevant Orders   CBC   T-helper cells (CD4) count (not at [facility])   Comprehensive metabolic panel   Lipid panel   RPR   HIV-1 RNA quant-no reflex-bld      [facility], MD [facility] for Infectious Disease [facility] [phone] pager   [phone] cell 05/16/2022, 2:24 PM

## 2022-05-17 ENCOUNTER — Other Ambulatory Visit: Payer: Self-pay

## 2022-05-17 NOTE — Progress Notes (Signed)
ERROR

## 2022-06-01 ENCOUNTER — Other Ambulatory Visit: Payer: Self-pay | Admitting: Internal Medicine

## 2022-06-01 DIAGNOSIS — B20 Human immunodeficiency virus [HIV] disease: Secondary | ICD-10-CM

## 2022-06-15 ENCOUNTER — Other Ambulatory Visit: Payer: Self-pay | Admitting: Internal Medicine

## 2022-06-15 DIAGNOSIS — B20 Human immunodeficiency virus [HIV] disease: Secondary | ICD-10-CM

## 2022-11-13 ENCOUNTER — Ambulatory Visit
Admission: RE | Admit: 2022-11-13 | Discharge: 2022-11-13 | Disposition: A | Discharge status: Home / Self Care | Payer: BC Managed Care – PPO | Source: Ambulatory Visit | Attending: Family Medicine | Admitting: Family Medicine

## 2022-11-13 ENCOUNTER — Other Ambulatory Visit: Payer: Self-pay | Admitting: Family Medicine

## 2022-11-13 DIAGNOSIS — R61 Generalized hyperhidrosis: Secondary | ICD-10-CM

## 2022-11-30 ENCOUNTER — Other Ambulatory Visit: Payer: Self-pay

## 2022-11-30 DIAGNOSIS — B20 Human immunodeficiency virus [HIV] disease: Secondary | ICD-10-CM

## 2022-11-30 MED ORDER — BIKTARVY 50-200-25 MG PO TABS
1.0000 | ORAL_TABLET | Freq: Every day | ORAL | 5 refills | Status: DC
Start: 2022-11-30 — End: 2023-05-11

## 2023-01-05 ENCOUNTER — Other Ambulatory Visit: Payer: Self-pay | Admitting: Family Medicine

## 2023-01-05 DIAGNOSIS — Z1231 Encounter for screening mammogram for malignant neoplasm of breast: Secondary | ICD-10-CM

## 2023-02-12 ENCOUNTER — Ambulatory Visit: Payer: BC Managed Care – PPO

## 2023-02-15 ENCOUNTER — Ambulatory Visit
Admission: RE | Admit: 2023-02-15 | Discharge: 2023-02-15 | Disposition: A | Discharge status: Home / Self Care | Payer: BC Managed Care – PPO | Source: Ambulatory Visit | Attending: Family Medicine | Admitting: Family Medicine

## 2023-02-15 DIAGNOSIS — Z1231 Encounter for screening mammogram for malignant neoplasm of breast: Secondary | ICD-10-CM

## 2023-05-11 ENCOUNTER — Other Ambulatory Visit: Payer: Self-pay

## 2023-05-11 ENCOUNTER — Encounter: Payer: Self-pay | Admitting: Infectious Diseases

## 2023-05-11 ENCOUNTER — Ambulatory Visit (INDEPENDENT_AMBULATORY_CARE_PROVIDER_SITE_OTHER): Payer: BC Managed Care – PPO | Admitting: Infectious Diseases

## 2023-05-11 VITALS — BP 153/83 | HR 81 | Temp 97.4°F | Ht 60.0 in | Wt 165.0 lb

## 2023-05-11 DIAGNOSIS — Z23 Encounter for immunization: Secondary | ICD-10-CM

## 2023-05-11 DIAGNOSIS — B20 Human immunodeficiency virus [HIV] disease: Secondary | ICD-10-CM

## 2023-05-11 DIAGNOSIS — Z113 Encounter for screening for infections with a predominantly sexual mode of transmission: Secondary | ICD-10-CM

## 2023-05-11 DIAGNOSIS — Z0000 Encounter for general adult medical examination without abnormal findings: Secondary | ICD-10-CM

## 2023-05-11 DIAGNOSIS — Z5181 Encounter for therapeutic drug level monitoring: Secondary | ICD-10-CM

## 2023-05-11 MED ORDER — BIKTARVY 50-200-25 MG PO TABS: 1.0000 | ORAL_TABLET | Freq: Every day | ORAL | 11 refills | Status: DC

## 2023-05-11 NOTE — Addendum Note (Signed)
Addended by: Odette Fraction on: 05/11/2023 01:37 PM   Modules accepted: Orders

## 2023-05-11 NOTE — Progress Notes (Signed)
[address], [place], [place], [address]                                                                  Phn. [phone]; Fax: [phone]                                                                             Date: 05/11/23  Reason for Visit: Routine HIV care.   HPI: Chloe Hancock is a 72 y.o.old female with a history of HIV, DM, migrane who is here for regular fu. Patient previously followed by Dr Orvan Falconer.   Last seen on 05/16/2022, seen by Dr Orvan Falconer Lab Results  Component Value Date   HIV1RNAQUANT Not Detected 05/01/2022   Lab Results  Component Value Date   CD4TABS 510 05/01/2022   CD4TABS 421 04/28/2021   CD4TABS 452 04/28/2020    Interval hx/current visit: Reports taking Biktarvy without missing doses or concerns regarding adherence. She complaints of facial sweating at night for the past 2-3 months. It is not drenching or soaking clothes. The sweating is not associated with fever, chills. Denies cough, chest pain, shortness of breath, nausea, vomiting, diarrhea, or changes in appetite or weight or urination. 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 She was born in [place] and has been living in the [place] for over 30 years. She follows with dental clinic. She has no other complaints.    ROS: As stated in above HPI; all other systems were reviewed and are otherwise negative unless noted below  No reported fever / chills, night sweats, unintentional weight loss, acute visual change, odynophagia, chest pain/pressure, new or worsened SOB or WOB, nausea, vomiting, diarrhea, dysuria, GU discharge, syncope,  seizures, red/hot swollen joints, hallucinations / delusions, rashes, new allergies, unusual / excessive bleeding, swollen lymph nodes, or new hospitalizations/ED visits/Urgent Care visits since the pt was last seen.  PMH/ PSH/ FamHx / Social Hx , medications and allergies reviewed and updated as appropriate; please see corresponding tab in EHR / prior notes                                        Current Outpatient Medications on File Prior to Visit  Medication Sig Dispense Refill   calcium carbonate (OS-CAL) 600 MG TABS Take 600 mg by mouth daily.     cholecalciferol (VITAMIN D) 1000  UNITS tablet Take 1,000 Units by mouth daily.     glucose blood test strip Use as instructed up to 2 x daily 100 each 12   Multiple Vitamins-Minerals (MULTIVITAMIN & MINERAL PO) Take by mouth.       RA VITAMIN B-6 50 MG tablet take 1 tablet by mouth once daily 30 tablet 5   rosuvastatin (CRESTOR) 10 MG tablet Take 10 mg by mouth daily.  1   No current facility-administered medications on file prior to visit.    No Known Allergies  Past Medical History:  Diagnosis Date   Diabetes mellitus without complication (HCC)    Headache(784.0)    HIV positive (HCC) 1998   Irregular bleeding    Migraines    Past Surgical History:  Procedure Laterality Date   TUBAL LIGATION     Social History   Socioeconomic History   Marital status: Divorced    Spouse name: Not on file   Number of children: Not on file   Years of education: Not on file   Highest education level: Not on file  Occupational History   Not on file  Tobacco Use   Smoking status: Never   Smokeless tobacco: Never  Substance and Sexual Activity   Alcohol use: Yes    Alcohol/week: 1.0 standard drink of alcohol    Types: 1 Glasses of wine per week    Comment: occ   Drug use: No   Sexual activity: Not Currently    Comment: declined condoms  Other Topics Concern   Not on file  Social History Narrative   Not on file   Social Determinants  of Health   Financial Resource Strain: Not on file  Food Insecurity: Not on file  Transportation Needs: Not on file  Physical Activity: Not on file  Stress: Not on file  Social Connections: Not on file  Intimate Partner Violence: Not on file   Family History  Problem Relation Age of Onset   Diabetes Father     Vitals  BP (!) 153/83   Pulse 81   Temp (!) 97.4 F (36.3 C) (Temporal)   Ht 5' (1.524 m)   Wt 165 lb (74.8 kg)   SpO2 100%   BMI 32.22 kg/m   Examination  Gen: no acute distress HEENT: [place]/AT, no scleral icterus, no pale conjunctivae, hearing normal, oral mucosa moist Neck: Supple Cardio: Regular rate and rhythm, s1s2 Resp: Pulmonary effort normal in room air, normal breath sounds  GI: nondistended GU: Musc: Extremities: No pedal edema Skin: No rashes Neuro: grossly non focal , awake, alert and oriented * 3  Psych: Calm, cooperative  Lab Results HIV 1 RNA Quant (Copies/mL)  Date Value  05/01/2022 Not Detected  04/28/2021 Not Detected  04/28/2020 <20   CD4 T Cell Abs (/uL)  Date Value  05/01/2022 510  04/28/2021 421  04/28/2020 452   No results found for: "HIV1GENOSEQ" Lab Results  Component Value Date   WBC 5.6 05/01/2022   HGB 13.3 05/01/2022   HCT 40.4 05/01/2022   MCV 92.0 05/01/2022   PLT 203 05/01/2022    Lab Results  Component Value Date   CREATININE 0.63 05/01/2022   BUN 23 05/01/2022   NA 142 05/01/2022   K 4.3 05/01/2022   CL 108 05/01/2022   CO2 24 05/01/2022   Lab Results  Component Value Date   ALT 17 05/01/2022   AST 17 05/01/2022   ALKPHOS 57 03/02/2017   BILITOT 0.2 05/01/2022  Lab Results  Component Value Date   CHOL 159 04/28/2020   TRIG 72 04/28/2020   HDL 56 04/28/2020   LDLCALC 87 04/28/2020   Lab Results  Component Value Date   HAV REACTIVE (A) 03/02/2017   Lab Results  Component Value Date   HEPBSAG No 09/10/2006   HEPBSAB NON-REACTIVE 03/02/2017   Lab Results  Component Value Date   HCVAB No  09/10/2006   Lab Results  Component Value Date   CHLAMYDIAWP Negative 04/28/2020   N Negative 04/28/2020   No results found for: "GCPROBEAPT" No results found for: "QUANTGOLD"    Health Maintenance: Immunization History  Administered Date(s) Administered   Fluad Quad(high Dose 65+) 05/12/2021, 05/16/2022   H1N1 06/25/2008   Hepatitis A 11/02/2008, 05/11/2009   Hepatitis B 09/14/2001, 11/27/2001, 03/20/2002   Influenza Split 06/16/2011, 04/30/2012   Influenza Whole 06/27/2005, 04/25/2010   Influenza,inj,Quad PF,6+ Mos 03/25/2014, 04/01/2018, 04/16/2019   Influenza-Unspecified 04/30/2013, 04/17/2015, 04/17/2019   MMR 05/21/2015, 06/21/2015   Moderna SARS-COV2 Booster Vaccination 05/24/2020   Moderna Sars-Covid-2 Vaccination 08/28/2019, 09/30/2019   Pfizer Covid-19 Vaccine Bivalent Booster 28yrs & up 05/12/2021   Pfizer(Comirnaty)Fall Seasonal Vaccine 12 years and older 05/16/2022   Pneumococcal Conjugate-13 07/03/2014   Pneumococcal Polysaccharide-23 05/27/2002, 06/25/2008, 07/15/2015   Td 05/21/2015   Tdap 06/27/2012   Typhoid Inactivated 03/12/2017   Varicella 05/21/2015, 06/21/2015    Assessment/Plan: # HIV  - well controlled  - continue Biktarvy. Aware about DDIs calcium carbonate and Mvs and need to space out  - labs today  - fu in 6 months   # STD Screening  - no acute concerns - Urine GC and RPR  # Immunization  - Flu vaccine today, discussed to get COVID with PCP as not available in the clinic   #Health maintenance - lipid panel - on rosuvatatin for dyslipidemia  - She is 72, need of  Ca screening per PCP   Patient's labs were reviewed as well as his previous records. Patients questions were addressed and answered. Safe sex counseling done.   I have personally spent 43 minutes involved in face-to-face and non-face-to-face activities for this patient on the day of the visit. Professional time spent includes the following activities: Preparing to see the  patient (review of tests), Obtaining and/or reviewing separately obtained history (admission/discharge record), Performing a medically appropriate examination and/or evaluation , Ordering medications/tests/procedures, referring and communicating with other health care professionals, Documenting clinical information in the EMR, Independently interpreting results (not separately reported), Communicating results to the patient/family/caregiver, Counseling and educating the patient/family/caregiver and Care coordination (not separately reported).    Electronically signed by:  Odette Fraction, MD Infectious Disease Physician [facility] [address] [place], [place] [address] Phone: [phone]  Fax: [phone]

## 2023-05-16 LAB — COMPREHENSIVE METABOLIC PANEL
AG Ratio: 1.5 (calc) (ref 1.0–2.5)
ALT: 18 U/L (ref 6–29)
AST: 20 U/L (ref 10–35)
Albumin: 4 g/dL (ref 3.6–5.1)
Alkaline phosphatase (APISO): 53 U/L (ref 37–153)
BUN: 18 mg/dL (ref 7–25)
CO2: 29 mmol/L (ref 20–32)
Calcium: 9.1 mg/dL (ref 8.6–10.4)
Chloride: 104 mmol/L (ref 98–110)
Creat: 0.61 mg/dL (ref 0.60–1.00)
Globulin: 2.7 g/dL (ref 1.9–3.7)
Glucose, Bld: 142 mg/dL — ABNORMAL HIGH (ref 65–99)
Potassium: 4.3 mmol/L (ref 3.5–5.3)
Sodium: 140 mmol/L (ref 135–146)
Total Bilirubin: 0.5 mg/dL (ref 0.2–1.2)
Total Protein: 6.7 g/dL (ref 6.1–8.1)

## 2023-05-16 LAB — CBC
HCT: 39.8 % (ref 35.0–45.0)
Hemoglobin: 13 g/dL (ref 11.7–15.5)
MCH: 30 pg (ref 27.0–33.0)
MCHC: 32.7 g/dL (ref 32.0–36.0)
MCV: 91.7 fL (ref 80.0–100.0)
MPV: 10.9 fL (ref 7.5–12.5)
Platelets: 180 10*3/uL (ref 140–400)
RBC: 4.34 10*6/uL (ref 3.80–5.10)
RDW: 13.5 % (ref 11.0–15.0)
WBC: 4 10*3/uL (ref 3.8–10.8)

## 2023-05-16 LAB — HIV RNA, RTPCR W/R GT (RTI, PI,INT)
HIV 1 RNA Quant: NOT DETECTED {copies}/mL
HIV-1 RNA Quant, Log: NOT DETECTED {Log}

## 2023-05-16 LAB — RPR: RPR Ser Ql: NONREACTIVE

## 2023-05-16 LAB — T-HELPER CELLS (CD4) COUNT (NOT AT ARMC)
Absolute CD4: 430 {cells}/uL — ABNORMAL LOW (ref 490–1740)
CD4 T Helper %: 28 % — ABNORMAL LOW (ref 30–61)
Total lymphocyte count: 1519 {cells}/uL (ref 850–3900)

## 2023-11-13 NOTE — Progress Notes (Signed)
 The ASCVD Risk score (Arnett DK, et al., 2019) failed to calculate for the following reasons:   Cannot find a previous HDL lab   Cannot find a previous total cholesterol lab   Unable to determine if patient is Non-Hispanic African American   Arlon Bergamo, Scientist, research (physical sciences), Charity fundraiser

## 2023-11-19 ENCOUNTER — Other Ambulatory Visit: Payer: Self-pay | Admitting: Family Medicine

## 2023-11-19 DIAGNOSIS — Z1231 Encounter for screening mammogram for malignant neoplasm of breast: Secondary | ICD-10-CM

## 2023-11-19 DIAGNOSIS — M85851 Other specified disorders of bone density and structure, right thigh: Secondary | ICD-10-CM

## 2024-02-22 ENCOUNTER — Ambulatory Visit
Admission: RE | Admit: 2024-02-22 | Discharge: 2024-02-22 | Disposition: A | Discharge status: Home / Self Care | Source: Ambulatory Visit | Attending: Family Medicine | Admitting: Family Medicine

## 2024-02-22 DIAGNOSIS — Z1231 Encounter for screening mammogram for malignant neoplasm of breast: Secondary | ICD-10-CM

## 2024-04-17 ENCOUNTER — Other Ambulatory Visit: Payer: Self-pay | Admitting: Infectious Diseases

## 2024-04-17 DIAGNOSIS — B20 Human immunodeficiency virus [HIV] disease: Secondary | ICD-10-CM

## 2024-04-23 ENCOUNTER — Telehealth: Payer: Self-pay | Admitting: Infectious Diseases

## 2024-04-23 NOTE — Telephone Encounter (Signed)
 error

## 2024-05-15 ENCOUNTER — Other Ambulatory Visit: Payer: Self-pay | Admitting: Infectious Diseases

## 2024-05-15 DIAGNOSIS — B20 Human immunodeficiency virus [HIV] disease: Secondary | ICD-10-CM

## 2024-05-15 NOTE — Telephone Encounter (Signed)
Pending appt

## 2024-05-29 ENCOUNTER — Encounter: Payer: Self-pay | Admitting: Infectious Diseases

## 2024-05-29 ENCOUNTER — Other Ambulatory Visit (HOSPITAL_COMMUNITY)
Admission: RE | Admit: 2024-05-29 | Discharge: 2024-05-29 | Disposition: A | Discharge status: Home / Self Care | Source: Ambulatory Visit | Attending: Infectious Diseases | Admitting: Infectious Diseases

## 2024-05-29 ENCOUNTER — Ambulatory Visit (INDEPENDENT_AMBULATORY_CARE_PROVIDER_SITE_OTHER): Admitting: Infectious Diseases

## 2024-05-29 ENCOUNTER — Other Ambulatory Visit: Payer: Self-pay

## 2024-05-29 VITALS — BP 147/81 | HR 94 | Temp 97.6°F | Ht 60.0 in | Wt 167.0 lb

## 2024-05-29 DIAGNOSIS — Z0000 Encounter for general adult medical examination without abnormal findings: Secondary | ICD-10-CM

## 2024-05-29 DIAGNOSIS — Z79899 Other long term (current) drug therapy: Secondary | ICD-10-CM | POA: Diagnosis not present

## 2024-05-29 DIAGNOSIS — Z5181 Encounter for therapeutic drug level monitoring: Secondary | ICD-10-CM

## 2024-05-29 DIAGNOSIS — Z113 Encounter for screening for infections with a predominantly sexual mode of transmission: Secondary | ICD-10-CM | POA: Diagnosis present

## 2024-05-29 DIAGNOSIS — R61 Generalized hyperhidrosis: Secondary | ICD-10-CM | POA: Insufficient documentation

## 2024-05-29 DIAGNOSIS — E785 Hyperlipidemia, unspecified: Secondary | ICD-10-CM

## 2024-05-29 DIAGNOSIS — Z7185 Encounter for immunization safety counseling: Secondary | ICD-10-CM | POA: Insufficient documentation

## 2024-05-29 DIAGNOSIS — Z23 Encounter for immunization: Secondary | ICD-10-CM | POA: Diagnosis not present

## 2024-05-29 DIAGNOSIS — B20 Human immunodeficiency virus [HIV] disease: Secondary | ICD-10-CM

## 2024-05-29 MED ORDER — BIKTARVY 50-200-25 MG PO TABS
1.0000 | ORAL_TABLET | Freq: Every day | ORAL | 11 refills | Status: AC
Start: 2024-05-29 — End: ?

## 2024-05-29 MED ORDER — BIKTARVY 50-200-25 MG PO TABS: 1.0000 | ORAL_TABLET | Freq: Every day | ORAL | 11 refills | Status: DC

## 2024-05-29 NOTE — Progress Notes (Signed)
 [address], [place], [place], [address]                                                                  Phn. [phone]; Fax: [phone]                                                                             Date: 05/29/24  Reason for Visit: Routine HIV care.   HPI: Chloe Hancock is a 73 y.o.old female with a history of HIV, DM, migrane who is here for regular fu. Patient previously followed by Dr Elaine.   Interval hx/current visit: Reports compliance with PO Biktarvy  with no missed doses or concerns. Saw PCP a week prior and had thyroid checked which was normal. She reports night sweats going on for approx 2 years, some days are bad, some are not. Denies fevers, chills, chronic cough, loss of appetite, weight loss or other symptoms. Declined covid vaccine but OK with PCV 20. Has gotten flu vaccine. No concerns otherwise.   ROS: As stated in above HPI; all other systems were reviewed and are otherwise negative unless noted below  No reported fever / chills, unintentional weight loss, acute visual change, odynophagia, chest pain/pressure, new or worsened SOB or WOB, nausea, vomiting, diarrhea, dysuria, GU discharge, syncope, seizures, red/hot swollen joints, hallucinations / delusions, rashes, new allergies, unusual / excessive bleeding, swollen lymph nodes, or new hospitalizations/ED visits/Urgent Care visits since the pt was last seen.  PMH/ PSH/ FamHx / Social Hx , medications and allergies reviewed and updated as appropriate; please see corresponding tab in EHR / prior notes                                        Current Outpatient Medications on File Prior to Visit  Medication Sig Dispense Refill   BIKTARVY  50-200-25 MG TABS tablet TAKE 1 TABLET BY MOUTH 1 TIME A DAY 30  tablet 0   calcium  carbonate (OS-CAL) 600 MG TABS Take 600 mg by mouth daily.     cholecalciferol (VITAMIN D) 1000 UNITS tablet Take 1,000 Units by mouth daily.     glucose blood test strip Use as instructed up to 2 x daily 100 each 12   Multiple Vitamins-Minerals (MULTIVITAMIN & MINERAL PO) Take by mouth.       RA VITAMIN B-6 50 MG tablet take 1 tablet by mouth once daily 30 tablet 5   rosuvastatin  (CRESTOR ) 10 MG tablet Take 10 mg by mouth daily.  1   No current facility-administered  medications on file prior to visit.    No Known Allergies  Past Medical History:  Diagnosis Date   Diabetes mellitus without complication (HCC)    Headache(784.0)    HIV positive (HCC) 1998   Irregular bleeding    Migraines    Past Surgical History:  Procedure Laterality Date   TUBAL LIGATION     Social History   Socioeconomic History   Marital status: Divorced    Spouse name: Not on file   Number of children: Not on file   Years of education: Not on file   Highest education level: Not on file  Occupational History   Not on file  Tobacco Use   Smoking status: Never   Smokeless tobacco: Never  Substance and Sexual Activity   Alcohol use: Yes    Alcohol/week: 1.0 standard drink of alcohol    Types: 1 Glasses of wine per week    Comment: occ   Drug use: No   Sexual activity: Not Currently    Comment: declined condoms  Other Topics Concern   Not on file  Social History Narrative   Not on file   Social Drivers of Health   Financial Resource Strain: Not on file  Food Insecurity: Not on file  Transportation Needs: Not on file  Physical Activity: Not on file  Stress: Not on file  Social Connections: Not on file  Intimate Partner Violence: Not on file   Family History  Problem Relation Age of Onset   Diabetes Father     Vitals  BP (!) 147/81   Pulse 94   Temp 97.6 F (36.4 C) (Temporal)   Ht 5' (1.524 m)   Wt 167 lb (75.8 kg)   SpO2 96%   BMI 32.61 kg/m     Examination  Gen: no acute distress HEENT: [place]/AT, no scleral icterus, no pale conjunctivae, hearing normal, oral mucosa moist Neck: Supple Cardio: Regular rate  Resp: Pulmonary effort normal in room air GI: nondistended GU: Musc: Extremities: No pedal edema Skin: No rashes Neuro: grossly non focal , awake, alert and oriented * 3  Psych: Calm, cooperative  Lab Results HIV 1 RNA Quant  Date Value  05/11/2023 NOT DETECTED copies/mL  05/01/2022 Not Detected Copies/mL  04/28/2021 Not Detected Copies/mL   CD4 T Cell Abs (/uL)  Date Value  05/01/2022 510  04/28/2021 421  04/28/2020 452   No results found for: HIV1GENOSEQ Lab Results  Component Value Date   WBC 4.0 05/11/2023   HGB 13.0 05/11/2023   HCT 39.8 05/11/2023   MCV 91.7 05/11/2023   PLT 180 05/11/2023    Lab Results  Component Value Date   CREATININE 0.61 05/11/2023   BUN 18 05/11/2023   NA 140 05/11/2023   K 4.3 05/11/2023   CL 104 05/11/2023   CO2 29 05/11/2023   Lab Results  Component Value Date   ALT 18 05/11/2023   AST 20 05/11/2023   ALKPHOS 57 03/02/2017   BILITOT 0.5 05/11/2023    Lab Results  Component Value Date   CHOL 159 04/28/2020   TRIG 72 04/28/2020   HDL 56 04/28/2020   LDLCALC 87 04/28/2020   Lab Results  Component Value Date   HAV REACTIVE (A) 03/02/2017   Lab Results  Component Value Date   HEPBSAG No 09/10/2006   HEPBSAB NON-REACTIVE 03/02/2017   Lab Results  Component Value Date   HCVAB No 09/10/2006   Lab Results  Component Value Date   CHLAMYDIAWP Negative  04/28/2020   N Negative 04/28/2020   No results found for: GCPROBEAPT No results found for: QUANTGOLD    Health Maintenance: Immunization History  Administered Date(s) Administered   Fluad Quad(high Dose 65+) 05/12/2021, 05/16/2022   Fluad Trivalent(High Dose 65+) 05/11/2023   H1N1 06/25/2008   Hepatitis A 11/02/2008, 05/11/2009   Hepatitis B 09/14/2001, 11/27/2001, 03/20/2002   Influenza  Split 06/16/2011, 04/30/2012   Influenza Whole 06/27/2005, 04/25/2010   Influenza,inj,Quad PF,6+ Mos 03/25/2014, 04/01/2018, 04/16/2019   Influenza-Unspecified 04/30/2013, 04/17/2015, 04/17/2019   MMR 05/21/2015, 06/21/2015   Moderna SARS-COV2 Booster Vaccination 05/24/2020   Moderna Sars-Covid-2 Vaccination 08/28/2019, 09/30/2019   Pfizer Covid-19 Vaccine Bivalent Booster 48yrs & up 05/12/2021   Pfizer(Comirnaty)Fall Seasonal Vaccine 12 years and older 05/16/2022   Pneumococcal Conjugate-13 07/03/2014   Pneumococcal Polysaccharide-23 05/27/2002, 06/25/2008, 07/15/2015   Td 05/21/2015   Tdap 06/27/2012   Typhoid Inactivated 03/12/2017   Varicella 05/21/2015, 06/21/2015    Assessment/Plan: # HIV - Medication Adherence assessed, side effects reviewed/discussed and DDIs reviewed  - Continue Biktarvy . Aware about DDIs calcium  carbonate and Mvs and need to space out  - Labs today  - fu in 6 months   # STD Screening  - no acute concerns - Urine GC and RPR   # Dyslipidemia - Lipid panel today  - On rosuvastatin    # ? Night sweats  - Blood cultures *2   # Immunization  - PCV 20 today   #Health maintenance - She is 72, need of  Ca screening per PCP   Patient's labs were reviewed as well as his previous records. Patients questions were addressed and answered. Safe sex counseling done.  I spent 30 minutes involved in face-to-face and non-face-to-face activities for this patient on the day of the visit. Professional time spent includes the following activities: Preparing to see the patient (review of tests), Performing a medically appropriate examination and evaluation , Ordering medications/labs and vaccine, referring and communicating with other health care professionals, Documenting clinical information in the EMR, Independently interpreting results (not separately reported), Communicating results to the patient, Counseling and educating the patient and Care coordination (not  separately reported).    Electronically signed by:  Annalee Orem, MD Infectious Disease Physician [facility] [address] [place], [place] [address] Phone: [phone]  Fax: [phone]

## 2024-05-30 LAB — URINE CYTOLOGY ANCILLARY ONLY
Chlamydia: NEGATIVE
Comment: NEGATIVE
Comment: NORMAL
Neisseria Gonorrhea: NEGATIVE

## 2024-05-30 LAB — T-HELPER CELLS (CD4) COUNT (NOT AT ARMC)
CD4 % Helper T Cell: 22 % — ABNORMAL LOW (ref 33–65)
CD4 T Cell Abs: 430 /uL (ref 400–1790)

## 2024-06-02 ENCOUNTER — Ambulatory Visit: Payer: Self-pay | Admitting: Infectious Diseases

## 2024-06-04 LAB — LIPID PANEL
Cholesterol: 165 mg/dL (ref <200)
HDL: 68 mg/dL (ref >=50)
LDL Cholesterol (Calc): 79 mg/dL
Non-HDL Cholesterol (Calc): 97 mg/dL (ref <130)
Total CHOL/HDL Ratio: 2.4 (calc) (ref <5.0)
Triglycerides: 94 mg/dL (ref <150)

## 2024-06-04 LAB — CULTURE, BLOOD (SINGLE)
MICRO NUMBER:: 17232618
MICRO NUMBER:: 17232619
Result:: NO GROWTH
Result:: NO GROWTH
SPECIMEN QUALITY:: ADEQUATE
SPECIMEN QUALITY:: ADEQUATE

## 2024-06-04 LAB — COMPREHENSIVE METABOLIC PANEL WITH GFR
AG Ratio: 1.7 (calc) (ref 1.0–2.5)
ALT: 18 U/L (ref 6–29)
AST: 17 U/L (ref 10–35)
Albumin: 4.5 g/dL (ref 3.6–5.1)
Alkaline phosphatase (APISO): 58 U/L (ref 37–153)
BUN/Creatinine Ratio: 41 (calc) — ABNORMAL HIGH (ref 6–22)
BUN: 21 mg/dL (ref 7–25)
CO2: 25 mmol/L (ref 20–32)
Calcium: 9.4 mg/dL (ref 8.6–10.4)
Chloride: 105 mmol/L (ref 98–110)
Creat: 0.51 mg/dL — ABNORMAL LOW (ref 0.60–1.00)
Globulin: 2.6 g/dL (ref 1.9–3.7)
Glucose, Bld: 111 mg/dL — ABNORMAL HIGH (ref 65–99)
Potassium: 4.2 mmol/L (ref 3.5–5.3)
Sodium: 139 mmol/L (ref 135–146)
Total Bilirubin: 0.5 mg/dL (ref 0.2–1.2)
Total Protein: 7.1 g/dL (ref 6.1–8.1)
eGFR: 97 mL/min/1.73m2 (ref >=60)

## 2024-06-04 LAB — TEST AUTHORIZATION: TEST NAME:: 7600

## 2024-06-04 LAB — CBC
HCT: 40.4 % (ref 35.0–45.0)
Hemoglobin: 13.2 g/dL (ref 11.7–15.5)
MCH: 29.8 pg (ref 27.0–33.0)
MCHC: 32.7 g/dL (ref 32.0–36.0)
MCV: 91.2 fL (ref 80.0–100.0)
MPV: 11.3 fL (ref 7.5–12.5)
Platelets: 215 Thousand/uL (ref 140–400)
RBC: 4.43 Million/uL (ref 3.80–5.10)
RDW: 13.7 % (ref 11.0–15.0)
WBC: 5 Thousand/uL (ref 3.8–10.8)

## 2024-06-04 LAB — HIV RNA, RTPCR W/R GT (RTI, PI,INT)
HIV 1 RNA Quant: NOT DETECTED {copies}/mL
HIV-1 RNA Quant, Log: NOT DETECTED {Log_copies}/mL

## 2024-06-04 LAB — RPR: RPR Ser Ql: NONREACTIVE

## 2024-08-11 ENCOUNTER — Inpatient Hospital Stay (HOSPITAL_BASED_OUTPATIENT_CLINIC_OR_DEPARTMENT_OTHER): Admission: RE | Admit: 2024-08-11 | Source: Ambulatory Visit

## 2024-08-12 ENCOUNTER — Other Ambulatory Visit

## 2024-08-12 ENCOUNTER — Ambulatory Visit

## 2025-03-30 ENCOUNTER — Other Ambulatory Visit (HOSPITAL_BASED_OUTPATIENT_CLINIC_OR_DEPARTMENT_OTHER)

## 2025-04-28 ENCOUNTER — Ambulatory Visit: Admitting: Internal Medicine
